# Patient Record
Sex: Male | Born: 1940
Health system: Southern US, Community
[De-identification: ages and names within clinical notes are randomized; demographics above are authoritative.]

## PROBLEM LIST (undated history)

## (undated) DIAGNOSIS — E559 Vitamin D deficiency, unspecified: Secondary | ICD-10-CM

## (undated) DIAGNOSIS — K760 Fatty (change of) liver, not elsewhere classified: Secondary | ICD-10-CM

## (undated) DIAGNOSIS — T7840XA Allergy, unspecified, initial encounter: Secondary | ICD-10-CM

## (undated) DIAGNOSIS — C4490 Unspecified malignant neoplasm of skin, unspecified: Secondary | ICD-10-CM

## (undated) DIAGNOSIS — J45909 Unspecified asthma, uncomplicated: Secondary | ICD-10-CM

## (undated) DIAGNOSIS — U071 COVID-19: Secondary | ICD-10-CM

## (undated) DIAGNOSIS — Z85828 Personal history of other malignant neoplasm of skin: Secondary | ICD-10-CM

## (undated) HISTORY — DX: Personal history of other malignant neoplasm of skin: Z85.828

## (undated) HISTORY — PX: HERNIA REPAIR: SHX51

## (undated) HISTORY — DX: Fatty (change of) liver, not elsewhere classified: K76.0

## (undated) HISTORY — DX: Allergy, unspecified, initial encounter: T78.40XA

## (undated) HISTORY — PX: FEMUR FRACTURE SURGERY: SHX633

## (undated) HISTORY — PX: CHOLECYSTECTOMY: SHX55

## (undated) HISTORY — DX: Vitamin D deficiency, unspecified: E55.9

## (undated) HISTORY — DX: Unspecified malignant neoplasm of skin, unspecified: C44.90

## (undated) HISTORY — PX: NOSE SURGERY: SHX723

## (undated) HISTORY — DX: COVID-19: U07.1

## (undated) HISTORY — DX: Unspecified asthma, uncomplicated: J45.909

---

## 2006-02-14 ENCOUNTER — Ambulatory Visit: Payer: Self-pay | Admitting: Surgery

## 2009-09-28 ENCOUNTER — Ambulatory Visit: Payer: Self-pay | Admitting: Surgery

## 2009-10-05 ENCOUNTER — Ambulatory Visit: Payer: Self-pay

## 2009-10-06 ENCOUNTER — Ambulatory Visit: Payer: Self-pay | Admitting: Surgery

## 2013-09-03 ENCOUNTER — Encounter: Payer: Self-pay | Admitting: Podiatry

## 2013-09-04 ENCOUNTER — Encounter: Payer: Self-pay | Admitting: Podiatry

## 2013-09-04 ENCOUNTER — Ambulatory Visit (INDEPENDENT_AMBULATORY_CARE_PROVIDER_SITE_OTHER): Payer: Medicare HMO | Admitting: Podiatry

## 2013-09-04 ENCOUNTER — Ambulatory Visit (INDEPENDENT_AMBULATORY_CARE_PROVIDER_SITE_OTHER): Payer: Medicare HMO

## 2013-09-04 VITALS — BP 137/82 | HR 76 | Resp 16 | Ht 67.0 in | Wt 164.0 lb

## 2013-09-04 DIAGNOSIS — M79673 Pain in unspecified foot: Secondary | ICD-10-CM

## 2013-09-04 DIAGNOSIS — M7750 Other enthesopathy of unspecified foot: Secondary | ICD-10-CM

## 2013-09-04 DIAGNOSIS — M79609 Pain in unspecified limb: Secondary | ICD-10-CM

## 2013-09-04 MED ORDER — TRIAMCINOLONE ACETONIDE 10 MG/ML IJ SUSP
10.0000 mg | Freq: Once | INTRAMUSCULAR | Status: AC
  Administered 2013-09-04: 10 mg

## 2013-09-06 NOTE — Progress Notes (Signed)
Subjective:     Patient ID: Brian Valencia, male   DOB: 1940-12-14, 73 y.o.   MRN: [id number]  HPI patient points to right lateral foot stating that has been sore and making ambulation difficult. Does not remember specific injury but states been bothering him more over the last couple months   Review of Systems     Objective:   Physical Exam Neurovascular status unchanged with pain on the lateral side of the right foot and the extensor complex and also into the peroneal complex near its insertion into the base of the fifth metatarsal. Muscle strength was adequate with no range of motion loss noted    Assessment:     Tendinitis of the right lateral foot with inflammation and fluid buildup    Plan:     H&P performed and x-rays reviewed. Injected the lateral side 3 mg Kenalog 5 mg Xylocaine Marcaine mixture and instructed on physical therapy and ice therapy. Discussed this may require him mobilization if it does not improve

## 2013-09-18 ENCOUNTER — Encounter: Payer: Self-pay | Admitting: Podiatry

## 2013-09-18 ENCOUNTER — Ambulatory Visit (INDEPENDENT_AMBULATORY_CARE_PROVIDER_SITE_OTHER): Payer: Medicare HMO | Admitting: Podiatry

## 2013-09-18 VITALS — BP 127/80 | HR 76 | Resp 16

## 2013-09-18 DIAGNOSIS — M722 Plantar fascial fibromatosis: Secondary | ICD-10-CM

## 2013-09-18 DIAGNOSIS — M779 Enthesopathy, unspecified: Secondary | ICD-10-CM

## 2013-09-18 NOTE — Progress Notes (Signed)
Subjective:     Patient ID: Brian Valencia, male   DOB: April 22, 1941, 73 y.o.   MRN: [id number]  HPI patient states that my foot is doing a lot better on the outside. Points the plantar and lateral side of the foot around the peroneal tendon group and lateral plantar fascia   Review of Systems     Objective:   Physical Exam Neurovascular status unchanged with health history intact and patient is found to have diminished discomfort lateral and plantar foot with good range of motion subtalar midtarsal joint    Assessment:     Improved inflammation of the foot lateral and plantar    Plan:     Advised on physical therapy and supportive shoes and reappoint for [place] to recheck again as needed for consideration of orthotic

## 2013-09-21 ENCOUNTER — Ambulatory Visit: Payer: Self-pay | Admitting: Gastroenterology

## 2013-09-21 LAB — HM COLONOSCOPY

## 2013-09-23 LAB — PATHOLOGY REPORT

## 2014-10-04 ENCOUNTER — Ambulatory Visit: Payer: Self-pay | Admitting: Registered Nurse

## 2015-07-13 DIAGNOSIS — K4090 Unilateral inguinal hernia, without obstruction or gangrene, not specified as recurrent: Secondary | ICD-10-CM | POA: Diagnosis not present

## 2015-07-14 NOTE — H&P (Signed)
Brian Valencia              ACCOUNT NO.:  [id number]  MEDICAL RECORD NO.:  [id number]  LOCATION:                                 FACILITY:  PHYSICIAN:  Maryan Puls          DATE OF BIRTH:  1941/04/18  DATE OF ADMISSION:  07/19/2015 DATE OF DISCHARGE:                            HISTORY AND PHYSICAL   MEDICAL RECORD CURRENTLY NOT AVAILABLE.  Same-day surgery July 19, 2015.  CHIEF COMPLAINT:  Right inguinal hernia.  HISTORY OF PRESENT ILLNESS:  [name] is a 75 year old Caucasian male with a 2 year history of intermittent right groin discomfort.  He has also noticed swelling in this region.  The discomfort is more frequent and bothersome to him now and he comes in today for right inguinal herniorrhaphy.  PAST MEDICAL HISTORY:  ALLERGIES:  NO DRUG ALLERGIES.  CURRENT MEDICATIONS:  No medications.  PREVIOUS SURGICAL PROCEDURES:  Include cholecystectomy in 2013, left inguinal herniorrhaphy in 1985.  SOCIAL HISTORY:  The patient denies tobacco use.  He drinks 4-5 alcoholic beverages per week.  FAMILY HISTORY:  Remarkable for parents with diabetes and hypertension.  PAST AND CURRENT MEDICAL CONDITIONS:  Negative for significant medical diseases.  REVIEW OF SYSTEMS:  The patient denied chest pain, shortness of breath, diabetes, stroke, or hypertension.  PHYSICAL EXAMINATION:  GENERAL:  A well-nourished white male, in no distress. HEENT: Sclerae were clear.  Pupils were equally round, reactive to light and accommodation.  Extraocular movements were intact. NECK:  Supple.  No palpable cervical adenopathy. LUNGS:  Clear to auscultation. CARDIOVASCULAR:  Regular rhythm and rate without audible murmurs. ABDOMEN:  Soft abdomen. GU:  Circumcised.  Testes atrophic, 16 mL in size each and easily reducible right inguinal hernia. RECTAL:  Deferred. NEUROMUSCULAR:  Alert and oriented x3.  IMPRESSION:  Right inguinal hernia.  PLAN:  Right inguinal  herniorrhaphy.          ______________________________ Maryan Puls     MW/MEDQ  D:  07/13/2015  T:  07/13/2015  Job:  [id number]

## 2015-07-15 ENCOUNTER — Other Ambulatory Visit: Payer: Self-pay

## 2015-07-15 ENCOUNTER — Encounter: Payer: Self-pay | Admitting: *Deleted

## 2015-07-15 NOTE — Patient Instructions (Signed)
  Your procedure is scheduled on: 07-19-15 (TUESDAY) Report to [place] To find out your arrival time please call [phone] between 1PM - 3PM on 07-28-15 [facility])  Remember: Instructions that are not followed completely may result in serious medical risk, up to and including death, or upon the discretion of your surgeon and anesthesiologist your surgery may need to be rescheduled.    _X___ 1. Do not eat food or drink liquids after midnight. No gum chewing or hard candies.     _X___ 2. No Alcohol for 24 hours before or after surgery.   ____ 3. Bring all medications with you on the day of surgery if instructed.    _X___ 4. Notify your doctor if there is any change in your medical condition     (cold, fever, infections).     Do not wear jewelry, make-up, hairpins, clips or nail polish.  Do not wear lotions, powders, or perfumes. You may wear deodorant.  Do not shave 48 hours prior to surgery. Men may shave face and neck.  Do not bring valuables to the hospital.    [facility] is not responsible for any belongings or valuables.               Contacts, dentures or bridgework may not be worn into surgery.  Leave your suitcase in the car. After surgery it may be brought to your room.  For patients admitted to the hospital, discharge time is determined by your treatment team.   Patients discharged the day of surgery will not be allowed to drive home.   Please read over the following fact sheets that you were given:      ____ Take these medicines the morning of surgery with A SIP OF WATER:    1. NONE  2.   3.   4.  5.  6.  ____ Fleet Enema (as directed)   _X___ Use CHG Soap as directed  ____ Use inhalers on the day of surgery  ____ Stop metformin 2 days prior to surgery    ____ Take 1/2 of usual insulin dose the night before surgery and none on the morning of surgery.   ____ Stop Coumadin/Plavix/aspirin-PT ALREADY STOPPED ASA  ____ Stop  Anti-inflammatories-NO NSAIDS OR ASA PRODUCTS-TYLENOL OK   ____ Stop supplements until after surgery.    ____ Bring C-Pap to the hospital.

## 2015-07-18 ENCOUNTER — Encounter
Admission: RE | Admit: 2015-07-18 | Discharge: 2015-07-18 | Disposition: A | Discharge status: Home / Self Care | Payer: PPO | Source: Ambulatory Visit | Attending: Urology | Admitting: Urology

## 2015-07-18 DIAGNOSIS — K4090 Unilateral inguinal hernia, without obstruction or gangrene, not specified as recurrent: Secondary | ICD-10-CM | POA: Diagnosis not present

## 2015-07-18 DIAGNOSIS — Z85828 Personal history of other malignant neoplasm of skin: Secondary | ICD-10-CM | POA: Diagnosis not present

## 2015-07-18 DIAGNOSIS — Z01810 Encounter for preprocedural cardiovascular examination: Secondary | ICD-10-CM | POA: Diagnosis not present

## 2015-07-18 DIAGNOSIS — J45909 Unspecified asthma, uncomplicated: Secondary | ICD-10-CM | POA: Diagnosis not present

## 2015-07-19 ENCOUNTER — Ambulatory Visit
Admission: RE | Admit: 2015-07-19 | Discharge: 2015-07-19 | Disposition: A | Discharge status: Home / Self Care | Payer: PPO | Source: Ambulatory Visit | Attending: Urology | Admitting: Urology

## 2015-07-19 ENCOUNTER — Ambulatory Visit: Payer: PPO | Admitting: Anesthesiology

## 2015-07-19 ENCOUNTER — Encounter
Admission: RE | Disposition: A | Discharge status: Home / Self Care | Payer: Self-pay | Source: Ambulatory Visit | Attending: Urology

## 2015-07-19 ENCOUNTER — Encounter: Payer: Self-pay | Admitting: *Deleted

## 2015-07-19 DIAGNOSIS — K4090 Unilateral inguinal hernia, without obstruction or gangrene, not specified as recurrent: Secondary | ICD-10-CM | POA: Diagnosis not present

## 2015-07-19 DIAGNOSIS — Z85828 Personal history of other malignant neoplasm of skin: Secondary | ICD-10-CM | POA: Insufficient documentation

## 2015-07-19 DIAGNOSIS — J45909 Unspecified asthma, uncomplicated: Secondary | ICD-10-CM | POA: Insufficient documentation

## 2015-07-19 HISTORY — PX: INGUINAL HERNIA REPAIR: SHX194

## 2015-07-19 SURGERY — REPAIR, HERNIA, INGUINAL, ADULT
Anesthesia: General | Laterality: Right | Wound class: Clean Contaminated

## 2015-07-19 MED ORDER — LACTATED RINGERS IV SOLN
INTRAVENOUS | Status: DC
  Administered 2015-07-19 (×2): via INTRAVENOUS

## 2015-07-19 MED ORDER — NEOMYCIN-POLYMYXIN B GU 40-200000 IR SOLN
Status: AC
  Filled 2015-07-19: qty 2

## 2015-07-19 MED ORDER — OXYCODONE HCL 5 MG/5ML PO SOLN: 5.0000 mg | Freq: Once | ORAL | Status: DC | PRN

## 2015-07-19 MED ORDER — LIDOCAINE HCL (PF) 1 % IJ SOLN
INTRAMUSCULAR | Status: AC
  Filled 2015-07-19: qty 30

## 2015-07-19 MED ORDER — CEFAZOLIN SODIUM 1-5 GM-% IV SOLN
1.0000 g | Freq: Once | INTRAVENOUS | Status: AC
  Administered 2015-07-19: 1 g via INTRAVENOUS

## 2015-07-19 MED ORDER — BUPIVACAINE-EPINEPHRINE (PF) 0.5% -1:200000 IJ SOLN
INTRAMUSCULAR | Status: AC
  Filled 2015-07-19: qty 30

## 2015-07-19 MED ORDER — BUPIVACAINE-EPINEPHRINE (PF) 0.5% -1:200000 IJ SOLN
INTRAMUSCULAR | Status: DC | PRN
  Administered 2015-07-19: 10 mL via PERINEURAL

## 2015-07-19 MED ORDER — FAMOTIDINE 20 MG PO TABS
20.0000 mg | ORAL_TABLET | Freq: Once | ORAL | Status: AC
  Administered 2015-07-19: 20 mg via ORAL

## 2015-07-19 MED ORDER — MIDAZOLAM HCL 2 MG/2ML IJ SOLN
INTRAMUSCULAR | Status: DC | PRN
  Administered 2015-07-19: 2 mg via INTRAVENOUS
  Administered 2015-07-19: 80 mg via INTRAVENOUS

## 2015-07-19 MED ORDER — FENTANYL CITRATE (PF) 100 MCG/2ML IJ SOLN
INTRAMUSCULAR | Status: DC | PRN
  Administered 2015-07-19 (×4): 25 ug via INTRAVENOUS

## 2015-07-19 MED ORDER — LIDOCAINE HCL 1 % IJ SOLN
INTRAMUSCULAR | Status: DC | PRN
  Administered 2015-07-19: 10 mL

## 2015-07-19 MED ORDER — DOCUSATE SODIUM 100 MG PO CAPS: 200.0000 mg | ORAL_CAPSULE | Freq: Two times a day (BID) | ORAL | Status: DC

## 2015-07-19 MED ORDER — ONDANSETRON 8 MG PO TBDP: 8.0000 mg | ORAL_TABLET | Freq: Four times a day (QID) | ORAL | Status: DC | PRN

## 2015-07-19 MED ORDER — FENTANYL CITRATE (PF) 100 MCG/2ML IJ SOLN: 25.0000 ug | INTRAMUSCULAR | Status: DC | PRN

## 2015-07-19 MED ORDER — CEFAZOLIN SODIUM-DEXTROSE 2-3 GM-% IV SOLR
INTRAVENOUS | Status: AC
  Filled 2015-07-19: qty 50

## 2015-07-19 MED ORDER — ONDANSETRON HCL 4 MG/2ML IJ SOLN
INTRAMUSCULAR | Status: DC | PRN
  Administered 2015-07-19: 4 mg via INTRAVENOUS

## 2015-07-19 MED ORDER — NUCYNTA 50 MG PO TABS: 50.0000 mg | ORAL_TABLET | Freq: Four times a day (QID) | ORAL | Status: DC | PRN

## 2015-07-19 MED ORDER — FAMOTIDINE 20 MG PO TABS
ORAL_TABLET | ORAL | Status: AC
  Administered 2015-07-19: 20 mg via ORAL
  Filled 2015-07-19: qty 1

## 2015-07-19 MED ORDER — PHENYLEPHRINE HCL 10 MG/ML IJ SOLN
INTRAMUSCULAR | Status: DC | PRN
  Administered 2015-07-19 (×4): 100 ug via INTRAVENOUS

## 2015-07-19 MED ORDER — PROPOFOL 10 MG/ML IV BOLUS
INTRAVENOUS | Status: DC | PRN
  Administered 2015-07-19: 140 mg via INTRAVENOUS
  Administered 2015-07-19: 60 mg via INTRAVENOUS
  Administered 2015-07-19: 40 mg via INTRAVENOUS

## 2015-07-19 MED ORDER — OXYCODONE HCL 5 MG PO TABS: 5.0000 mg | ORAL_TABLET | Freq: Once | ORAL | Status: DC | PRN

## 2015-07-19 MED ORDER — NEOMYCIN-POLYMYXIN B GU 40-200000 IR SOLN
Status: DC | PRN
  Administered 2015-07-19: 2 mL

## 2015-07-19 MED ORDER — DEXAMETHASONE SODIUM PHOSPHATE 10 MG/ML IJ SOLN
INTRAMUSCULAR | Status: DC | PRN
  Administered 2015-07-19: 10 mg via INTRAVENOUS

## 2015-07-19 MED ORDER — CEFAZOLIN SODIUM 1-5 GM-% IV SOLN
INTRAVENOUS | Status: AC
  Administered 2015-07-19: 1 g via INTRAVENOUS
  Filled 2015-07-19: qty 50

## 2015-07-19 MED ORDER — GLYCOPYRROLATE 0.2 MG/ML IJ SOLN
INTRAMUSCULAR | Status: DC | PRN
  Administered 2015-07-19: 0.2 mg via INTRAVENOUS

## 2015-07-19 MED ORDER — KETOROLAC TROMETHAMINE 30 MG/ML IJ SOLN
INTRAMUSCULAR | Status: DC | PRN
  Administered 2015-07-19: 30 mg via INTRAVENOUS

## 2015-07-19 MED ORDER — CEPHALEXIN 500 MG PO CAPS: 500.0000 mg | ORAL_CAPSULE | Freq: Two times a day (BID) | ORAL | Status: DC

## 2015-07-19 SURGICAL SUPPLY — 35 items
BLADE SURG 15 STRL LF DISP TIS (BLADE) ×1 IMPLANT
BLADE SURG 15 STRL SS (BLADE) ×2
CANISTER SUCT 1200ML W/VALVE (MISCELLANEOUS) ×3 IMPLANT
CLOSURE WOUND 1/2 X4 (GAUZE/BANDAGES/DRESSINGS) ×1
DRAIN PENROSE 5/8X12 LTX STRL (DRAIN) IMPLANT
DRAPE LAPAROTOMY 100X77 ABD (DRAPES) ×3 IMPLANT
DRESSING TELFA 4X3 1S ST N-ADH (GAUZE/BANDAGES/DRESSINGS) ×3 IMPLANT
DRSG TEGADERM 4X4.75 (GAUZE/BANDAGES/DRESSINGS) ×3 IMPLANT
GLOVE BIO SURGEON STRL SZ7.5 (GLOVE) ×3 IMPLANT
GOWN STRL REUS W/ TWL LRG LVL3 (GOWN DISPOSABLE) ×1 IMPLANT
GOWN STRL REUS W/ TWL XL LVL3 (GOWN DISPOSABLE) ×1 IMPLANT
GOWN STRL REUS W/TWL LRG LVL3 (GOWN DISPOSABLE) ×2
GOWN STRL REUS W/TWL XL LVL3 (GOWN DISPOSABLE) ×2
KIT RM TURNOVER STRD PROC AR (KITS) ×3 IMPLANT
LABEL OR SOLS (LABEL) ×3 IMPLANT
LIQUID BAND (GAUZE/BANDAGES/DRESSINGS) ×3 IMPLANT
MESH HERNIA 4.5X10 SYS PRO LRG (Mesh General) ×1 IMPLANT
MESH HERNIA SYS PROLENE LG (Mesh General) ×2 IMPLANT
NDL SAFETY 18GX1.5 (NEEDLE) ×3 IMPLANT
NEEDLE HYPO 25X1 1.5 SAFETY (NEEDLE) ×3 IMPLANT
NS IRRIG 500ML POUR BTL (IV SOLUTION) ×3 IMPLANT
PACK BASIN MINOR ARMC (MISCELLANEOUS) ×3 IMPLANT
PAD GROUND ADULT SPLIT (MISCELLANEOUS) ×3 IMPLANT
PREP PVP WINGED SPONGE (MISCELLANEOUS) ×3 IMPLANT
STRIP CLOSURE SKIN 1/2X4 (GAUZE/BANDAGES/DRESSINGS) ×2 IMPLANT
SUT CHROMIC 2 0 SH (SUTURE) ×3 IMPLANT
SUT CHROMIC 3-0 (SUTURE)
SUT CHROMIC 3-0 54XMFL REEL CR (SUTURE)
SUT PLAIN 3 0 SH 27IN (SUTURE) ×3 IMPLANT
SUT SURGILON 0 BLK (SUTURE) ×6 IMPLANT
SUT VIC AB 4-0 PS2 18 (SUTURE) ×3 IMPLANT
SUTURE CHRMC 3-0 54XMFL REL CR (SUTURE) IMPLANT
SWABSTK COMLB BENZOIN TINCTURE (MISCELLANEOUS) ×3 IMPLANT
SYR BULB IRRIG 60ML STRL (SYRINGE) ×3 IMPLANT
SYRINGE 10CC LL (SYRINGE) ×3 IMPLANT

## 2015-07-19 NOTE — Op Note (Signed)
Preoperative diagnosis: Right inguinal hernia Postoperative diagnosis: Right inguinal hernia  Procedure: 1. Right inguinal herniorrhaphy                      2. Spermatic cord block   Surgeon: Otelia Limes. Yves Dill MD, FACS Anesthesia: Gen.  Indications:See the history and physical. After informed consent the above procedure(s) were requested     Technique and findings: After adequate general anesthesia had been obtained the the abdomen and perineum were prepped and draped in the usual fashion. A right inguinal crease incision was made sharply and then carried to the subcutaneous fatty tissue with electrocautery. Spermatic cord and inguinal hernia were identified. The hernia sac was dissected back to the internal ring and ligated with a 3-0 Surgilon suture. The external oblique fascia was then cleared of overlying fatty tissue. A pocket was created in the retropubic space using finger dissection. The pocket was irrigated with GU irrigant. The Ethicon PHS the graft was selected and circular portion unfurled into the retropubic space. The oblong portion was placed parallel to the external oblique fascial fibers beneath the external oblique fascia. A keyhole incision was made laterally in the oblong portion the graft to accommodate the spermatic cord. The edges of the heel incision were brought back together around the cord and anchored to the inguinal ligament with a 3- Surgilon suture. The distal portion of the oblong section of the graft was sutured to the pubic tubercle with a 3-0 Surgilon suture. The surgical field was copiously irrigated with GU irrigant. External oblique fascia was then reapproximated with interrupted 30 Surgilon suture. Atretic cord was placed into its normal anatomic position. The right cord block was performed with 10 cc of 1% plain Xylocaine. Subcutaneous block was performed with 1% Xylocaine with epinephrine. Subcutaneous fat was then reapproximated with interrupted 30 plain catgut and  skin was closed with 4-0 Vicryl subcutaneous closure. Dermabond, benzoin, Steri-Strips and sterile dressing were applied. Sponge, needle, and instrument counts were noted be correct. The procedure was then terminated and patient transferred to the recovery room in stable condition.

## 2015-07-19 NOTE — Anesthesia Procedure Notes (Signed)
Procedure Name: LMA Insertion Date/Time: 07/19/2015 2:49 PM Performed by: Silvana Newness Pre-anesthesia Checklist: Patient identified, Emergency Drugs available, Suction available, Patient being monitored and Timeout performed Patient Re-evaluated:Patient Re-evaluated prior to inductionOxygen Delivery Method: Circle system utilized Preoxygenation: Pre-oxygenation with 100% oxygen Intubation Type: IV induction Ventilation: Oral airway inserted - appropriate to patient size LMA: LMA inserted LMA Size: 5.0 Number of attempts: 3 Placement Confirmation: positive ETCO2 and breath sounds checked- equal and bilateral Tube secured with: Tape Dental Injury: Teeth and Oropharynx as per pre-operative assessment  Comments: First placed LMA 4.5, did not have adequate tidal volumes.  Removed then placed LMA 4 with inflation, still had a leak and small tidal volumes.  Removed and placed LMA 5 with inflation, good seal obtained, no leak, adequate tidal volumes.

## 2015-07-19 NOTE — Transfer of Care (Signed)
Immediate Anesthesia Transfer of Care Note  Patient: Brian Valencia  Procedure(s) Performed: Procedure(s): HERNIA REPAIR INGUINAL ADULT WITH MESH (Right)  Patient Location: PACU  Anesthesia Type:General  Level of Consciousness: awake, alert , oriented and patient cooperative  Airway & Oxygen Therapy: Patient Spontanous Breathing and Patient connected to face mask oxygen  Post-op Assessment: Report given to RN, Post -op Vital signs reviewed and stable and Patient moving all extremities X 4  Post vital signs: Reviewed and stable  Last Vitals:  Filed Vitals:   07/19/15 1404  BP: 159/84  Pulse: 76  Temp: [id number] C    Complications: No apparent anesthesia complications

## 2015-07-19 NOTE — Anesthesia Preprocedure Evaluation (Signed)
Anesthesia Evaluation  Patient identified by MRN, date of birth, ID band Patient awake    Reviewed: Allergy & Precautions, H&P , NPO status , Patient's Chart, lab work & pertinent test results  History of Anesthesia Complications Negative for: history of anesthetic complications  Airway Mallampati: III  TM Distance: >3 FB Neck ROM: limited    Dental no notable dental hx. (+) Poor Dentition, Missing, Upper Dentures, Lower Dentures   Pulmonary asthma ,    Pulmonary exam normal breath sounds clear to auscultation       Cardiovascular Exercise Tolerance: Good (-) angina(-) Past MI negative cardio ROS Normal cardiovascular exam Rhythm:regular Rate:Normal     Neuro/Psych negative neurological ROS  negative psych ROS   GI/Hepatic negative GI ROS, Neg liver ROS, neg GERD  ,  Endo/Other  negative endocrine ROS  Renal/GU negative Renal ROS  negative genitourinary   Musculoskeletal   Abdominal   Peds  Hematology negative hematology ROS (+)   Anesthesia Other Findings Past Medical History:   Skin cancer                                                 Past Surgical History:   HERNIA REPAIR                                                 NOSE SURGERY                                                 BMI    Body Mass Index   25.68 kg/m 2      Reproductive/Obstetrics negative OB ROS                             Anesthesia Physical Anesthesia Plan  ASA: II  Anesthesia Plan: General LMA   Post-op Pain Management:    Induction:   Airway Management Planned:   Additional Equipment:   Intra-op Plan:   Post-operative Plan:   Informed Consent: I have reviewed the patients History and Physical, chart, labs and discussed the procedure including the risks, benefits and alternatives for the proposed anesthesia with the patient or authorized representative who has indicated his/her understanding  and acceptance.   Dental Advisory Given  Plan Discussed with: Anesthesiologist, CRNA and Surgeon  Anesthesia Plan Comments:         Anesthesia Quick Evaluation

## 2015-07-19 NOTE — Anesthesia Postprocedure Evaluation (Signed)
Anesthesia Post Note  Patient: Brian Valencia  Procedure(s) Performed: Procedure(s) (LRB): HERNIA REPAIR INGUINAL ADULT WITH MESH (Right)  Patient location during evaluation: PACU Anesthesia Type: General Level of consciousness: awake and alert Pain management: pain level controlled Vital Signs Assessment: post-procedure vital signs reviewed and stable Respiratory status: spontaneous breathing, nonlabored ventilation, respiratory function stable and patient connected to nasal cannula oxygen Cardiovascular status: blood pressure returned to baseline and stable Postop Assessment: no signs of nausea or vomiting Anesthetic complications: no    Last Vitals:  Filed Vitals:   07/19/15 1655 07/19/15 1700  BP: 134/82 144/85  Pulse: 81 72  Temp:    Resp: 14 16    Last Pain:  Filed Vitals:   07/19/15 1711  PainSc: 1                  Precious Haws Teaira Croft

## 2015-07-19 NOTE — H&P (Signed)
Date of Initial H&P: 07/12/14  History reviewed, patient examined, no change in status, stable for surgery.

## 2015-07-19 NOTE — Discharge Instructions (Addendum)
PATIENT INSTRUCTIONS HERNIA  FOLLOW-UP:  Please make an appointment with your physician in 10 days.Inguinal Hernia, Adult Muscles help keep everything in the body in its proper place. But if a weak spot in the muscles develops, something can poke through. That is called a hernia. When this happens in the lower part of the belly (abdomen), it is called an inguinal hernia. (It takes its name from a part of the body in this region called the inguinal canal.) A weak spot in the wall of muscles lets some fat or part of the small intestine bulge through. An inguinal hernia can develop at any age. Men get them more often than women. CAUSES  In adults, an inguinal hernia develops over time.  It can be triggered by:  Suddenly straining the muscles of the lower abdomen.  Lifting heavy objects.  Straining to have a bowel movement. Difficult bowel movements (constipation) can lead to this.  Constant coughing. This may be caused by smoking or lung disease.  Being overweight.  Being pregnant.  Working at a job that requires long periods of standing or heavy lifting.  Having had an inguinal hernia before. One type can be an emergency situation. It is called a strangulated inguinal hernia. It develops if part of the small intestine slips through the weak spot and cannot get back into the abdomen. The blood supply can be cut off. If that happens, part of the intestine may die. This situation requires emergency surgery. SYMPTOMS  Often, a small inguinal hernia has no symptoms. It is found when a healthcare provider does a physical exam. Larger hernias usually have symptoms.   In adults, symptoms may include:  A lump in the groin. This is easier to see when the person is standing. It might disappear when lying down.  In men, a lump in the scrotum.  Pain or burning in the groin. This occurs especially when lifting, straining or coughing.  A dull ache or feeling of pressure in the groin.  Signs of  a strangulated hernia can include:  A bulge in the groin that becomes very painful and tender to the touch.  A bulge that turns red or purple.  Fever, nausea and vomiting.  Inability to have a bowel movement or to pass gas. DIAGNOSIS  To decide if you have an inguinal hernia, a healthcare provider will probably do a physical examination.  This will include asking questions about any symptoms you have noticed.  The healthcare provider might feel the groin area and ask you to cough. If an inguinal hernia is felt, the healthcare provider may try to slide it back into the abdomen.  Usually no other tests are needed. TREATMENT  Treatments can vary. The size of the hernia makes a difference. Options include:  Watchful waiting. This is often suggested if the hernia is small and you have had no symptoms.  No medical procedure will be done unless symptoms develop.  You will need to watch closely for symptoms. If any occur, contact your healthcare provider right away.  Surgery. This is used if the hernia is larger or you have symptoms.  Open surgery. This is usually an outpatient procedure (you will not stay overnight in a hospital). An cut (incision) is made through the skin in the groin. The hernia is put back inside the abdomen. The weak area in the muscles is then repaired by herniorrhaphy or hernioplasty. Herniorrhaphy: in this type of surgery, the weak muscles are sewn back together. Hernioplasty: a patch  or mesh is used to close the weak area in the abdominal wall.  Laparoscopy. In this procedure, a surgeon makes small incisions. A thin tube with a tiny video camera (called a laparoscope) is put into the abdomen. The surgeon repairs the hernia with mesh by looking with the video camera and using two long instruments. HOME CARE INSTRUCTIONS   After surgery to repair an inguinal hernia:  You will need to take pain medicine prescribed by your healthcare provider. Follow all directions  carefully.  You will need to take care of the wound from the incision.  Your activity will be restricted for awhile. This will probably include no heavy lifting for several weeks. You also should not do anything too active for a few weeks. When you can return to work will depend on the type of job that you have.  During "watchful waiting" periods, you should:  Maintain a healthy weight.  Eat a diet high in fiber (fruits, vegetables and whole grains).  Drink plenty of fluids to avoid constipation. This means drinking enough water and other liquids to keep your urine clear or pale yellow.  Do not lift heavy objects.  Do not stand for long periods of time.  Quit smoking. This should keep you from developing a frequent cough. SEEK MEDICAL CARE IF:   A bulge develops in your groin area.  You feel pain, a burning sensation or pressure in the groin. This might be worse if you are lifting or straining.  You develop a fever of more than 100.5 F (38.1 C). SEEK IMMEDIATE MEDICAL CARE IF:   Pain in the groin increases suddenly.  A bulge in the groin gets bigger suddenly and does not go down.  For men, there is sudden pain in the scrotum. Or, the size of the scrotum increases.  A bulge in the groin area becomes red or purple and is painful to touch.  You have nausea or vomiting that does not go away.  You feel your heart beating much faster than normal.  You cannot have a bowel movement or pass gas.  You develop a fever of more than 102.0 F (38.9 C).   This information is not intended to replace advice given to you by your health care provider. Make sure you discuss any questions you have with your health care provider.   Document Released: 11/11/2008 Document Revised: 09/17/2011 Document Reviewed: 12/27/2014 Elsevier Interactive Patient Education 2016 [employer].   Call your physician immediately if you have any fevers greater than 102.5, drainage from you wound that is not  clear or looks infected, persistent bleeding, increasing abdominal pain, problems urinating, or persistent nausea/vomiting.    WOUND CARE INSTRUCTIONS:  Keep a dry clean dressing on the wound if there is drainage. The initial bandage may be removed after 24 hours.  Once the wound has quit draining you may leave it open to air.  If clothing rubs against the wound or causes irritation and the wound is not draining you may cover it with a dry dressing during the daytime.  Try to keep the wound dry and avoid ointments on the wound unless directed to do so.  If the wound becomes bright red and painful or starts to drain infected material that is not clear, please contact your physician immediately.  If the wound is mildly pink and has a thick firm ridge underneath it, this is normal, and is referred to as a healing ridge.  This will resolve over the next  4-6 weeks.  DIET:  You may eat any foods that you can tolerate.  It is a good idea to eat a high fiber diet and take in plenty of fluids to prevent constipation.  If you do become constipated you may want to take a mild laxative or take ducolax tablets on a daily basis until your bowel habits are regular.  Constipation can be very uncomfortable, along with straining, after recent abdominal surgery.  ACTIVITY:  You are encouraged to cough and deep breath or use your incentive spirometer if you were given one, every 15-30 minutes when awake.  This will help prevent respiratory complications and low grade fevers post-operatively.  You may want to hug a pillow when coughing and sneezing to add additional support to the surgical area which will decrease pain during these times.  You are encouraged to walk and engage in light activity for the next two weeks.  You should not lift more than 20 pounds during this time frame as it could put you at increased risk for a hernia recurrence.  Twenty pounds is roughly equivalent to a plastic bag of groceries.    MEDICATIONS:   Try to take narcotic medications and anti-inflammatory medications, such as tylenol, ibuprofen, naprosyn, etc., with food.  This will minimize stomach upset from the medication.  Should you develop nausea and vomiting from the pain medication, or develop a rash, please discontinue the medication and contact your physician.  You should not drive, make important decisions, or operate machinery when taking narcotic pain medication.  QUESTIONS:  Please feel free to call your physician or the hospital operator if you have any questions, and they will be glad to assist you.

## 2015-07-20 ENCOUNTER — Encounter: Payer: Self-pay | Admitting: Urology

## 2015-07-21 LAB — SURGICAL PATHOLOGY

## 2015-07-22 ENCOUNTER — Encounter: Payer: Self-pay | Admitting: Urology

## 2016-04-02 DIAGNOSIS — Z08 Encounter for follow-up examination after completed treatment for malignant neoplasm: Secondary | ICD-10-CM | POA: Diagnosis not present

## 2016-04-02 DIAGNOSIS — Z09 Encounter for follow-up examination after completed treatment for conditions other than malignant neoplasm: Secondary | ICD-10-CM | POA: Diagnosis not present

## 2016-04-02 DIAGNOSIS — L570 Actinic keratosis: Secondary | ICD-10-CM | POA: Diagnosis not present

## 2016-04-02 DIAGNOSIS — Z1283 Encounter for screening for malignant neoplasm of skin: Secondary | ICD-10-CM | POA: Diagnosis not present

## 2016-04-02 DIAGNOSIS — Z872 Personal history of diseases of the skin and subcutaneous tissue: Secondary | ICD-10-CM | POA: Diagnosis not present

## 2016-04-02 DIAGNOSIS — Z85828 Personal history of other malignant neoplasm of skin: Secondary | ICD-10-CM | POA: Diagnosis not present

## 2016-07-31 DIAGNOSIS — H903 Sensorineural hearing loss, bilateral: Secondary | ICD-10-CM | POA: Diagnosis not present

## 2016-07-31 DIAGNOSIS — H6123 Impacted cerumen, bilateral: Secondary | ICD-10-CM | POA: Diagnosis not present

## 2016-11-01 DIAGNOSIS — H5203 Hypermetropia, bilateral: Secondary | ICD-10-CM | POA: Diagnosis not present

## 2017-01-14 DIAGNOSIS — C4441 Basal cell carcinoma of skin of scalp and neck: Secondary | ICD-10-CM | POA: Diagnosis not present

## 2017-01-14 DIAGNOSIS — D485 Neoplasm of uncertain behavior of skin: Secondary | ICD-10-CM | POA: Diagnosis not present

## 2017-01-29 DIAGNOSIS — C4441 Basal cell carcinoma of skin of scalp and neck: Secondary | ICD-10-CM | POA: Diagnosis not present

## 2017-01-29 DIAGNOSIS — C4491 Basal cell carcinoma of skin, unspecified: Secondary | ICD-10-CM | POA: Diagnosis not present

## 2017-02-13 DIAGNOSIS — B999 Unspecified infectious disease: Secondary | ICD-10-CM | POA: Diagnosis not present

## 2017-02-20 DIAGNOSIS — B999 Unspecified infectious disease: Secondary | ICD-10-CM | POA: Diagnosis not present

## 2017-07-29 DIAGNOSIS — I781 Nevus, non-neoplastic: Secondary | ICD-10-CM | POA: Diagnosis not present

## 2017-07-29 DIAGNOSIS — L570 Actinic keratosis: Secondary | ICD-10-CM | POA: Diagnosis not present

## 2017-07-29 DIAGNOSIS — Z86018 Personal history of other benign neoplasm: Secondary | ICD-10-CM | POA: Diagnosis not present

## 2017-07-29 DIAGNOSIS — L578 Other skin changes due to chronic exposure to nonionizing radiation: Secondary | ICD-10-CM | POA: Diagnosis not present

## 2017-07-29 DIAGNOSIS — Z85828 Personal history of other malignant neoplasm of skin: Secondary | ICD-10-CM | POA: Diagnosis not present

## 2017-07-29 DIAGNOSIS — L280 Lichen simplex chronicus: Secondary | ICD-10-CM | POA: Diagnosis not present

## 2017-11-07 ENCOUNTER — Encounter: Payer: Self-pay | Admitting: Podiatry

## 2017-11-07 ENCOUNTER — Ambulatory Visit: Payer: PPO | Admitting: Podiatry

## 2017-11-07 VITALS — BP 125/61 | HR 55

## 2017-11-07 DIAGNOSIS — M722 Plantar fascial fibromatosis: Secondary | ICD-10-CM

## 2017-11-07 DIAGNOSIS — R52 Pain, unspecified: Secondary | ICD-10-CM

## 2017-11-07 NOTE — Progress Notes (Signed)
This patient presents the office with chief complaint of pain noted on the bottom of his left foot.  He points to an area proximal to the metatarsal heads.  He says that the pain has been developing in this area for the last few weeks.  He denies any history of trauma or injury to the foot.  He has provided no self treatment nor sought any professional help. He has previous problems with his left foot related to a fracture of his left leg.  He was treated successfully by Dr. Paulla Dolly.  He presents the office today for an evaluation and treatment of his painful left foot.  General Appearance  Alert, conversant and in no acute stress.  Vascular  Dorsalis pedis and posterior tibial  pulses are palpable  bilaterally.  Capillary return is within normal limits  bilaterally. Temperature is within normal limits  bilaterally.  Neurologic  Senn-Weinstein monofilament wire test diminished  bilaterally. Muscle power within normal limits bilaterally.  Nails normal nails noted with no evidence of any bacterial or fungal infection  Orthopedic  No limitations of motion of motion feet .  No crepitus or effusions noted.  No bony pathology or digital deformities noted. Palpable pain noted to the distal aspect of the plantar fascia before it  inserts into the metatarsals left foot. HAV  B/L.  Skin  normotropic skin with no porokeratosis noted bilaterally.  No signs of infections or ulcers noted.    Plantar fasciitis left foot.  IE  Discussed this condition with this patient.  Recommended he wear power step insoles in his shoes.  He chose not to purchase these insoles today until he gets his dress shoes.  RTC prn.   Gardiner Barefoot DPM

## 2018-06-20 ENCOUNTER — Ambulatory Visit (INDEPENDENT_AMBULATORY_CARE_PROVIDER_SITE_OTHER): Payer: PPO | Admitting: Internal Medicine

## 2018-06-20 VITALS — BP 120/84 | HR 71 | Temp 97.5°F | Ht 67.0 in | Wt 167.0 lb

## 2018-06-20 DIAGNOSIS — Z125 Encounter for screening for malignant neoplasm of prostate: Secondary | ICD-10-CM

## 2018-06-20 DIAGNOSIS — R7303 Prediabetes: Secondary | ICD-10-CM | POA: Diagnosis not present

## 2018-06-20 DIAGNOSIS — E559 Vitamin D deficiency, unspecified: Secondary | ICD-10-CM

## 2018-06-20 DIAGNOSIS — R5383 Other fatigue: Secondary | ICD-10-CM | POA: Diagnosis not present

## 2018-06-20 DIAGNOSIS — Z1389 Encounter for screening for other disorder: Secondary | ICD-10-CM

## 2018-06-20 DIAGNOSIS — Z13220 Encounter for screening for lipoid disorders: Secondary | ICD-10-CM

## 2018-06-20 DIAGNOSIS — K4091 Unilateral inguinal hernia, without obstruction or gangrene, recurrent: Secondary | ICD-10-CM | POA: Diagnosis not present

## 2018-06-20 NOTE — Patient Instructions (Signed)

## 2018-06-20 NOTE — Progress Notes (Signed)
Pre visit review using our clinic review tool, if applicable. No additional management support is needed unless otherwise documented below in the visit note. 

## 2018-06-23 ENCOUNTER — Other Ambulatory Visit: Payer: Self-pay | Admitting: Internal Medicine

## 2018-06-23 ENCOUNTER — Other Ambulatory Visit (INDEPENDENT_AMBULATORY_CARE_PROVIDER_SITE_OTHER): Payer: PPO

## 2018-06-23 DIAGNOSIS — R5383 Other fatigue: Secondary | ICD-10-CM | POA: Diagnosis not present

## 2018-06-23 DIAGNOSIS — Z1389 Encounter for screening for other disorder: Secondary | ICD-10-CM | POA: Diagnosis not present

## 2018-06-23 DIAGNOSIS — R739 Hyperglycemia, unspecified: Secondary | ICD-10-CM

## 2018-06-23 DIAGNOSIS — Z125 Encounter for screening for malignant neoplasm of prostate: Secondary | ICD-10-CM

## 2018-06-23 DIAGNOSIS — E559 Vitamin D deficiency, unspecified: Secondary | ICD-10-CM

## 2018-06-23 DIAGNOSIS — Z13220 Encounter for screening for lipoid disorders: Secondary | ICD-10-CM

## 2018-06-23 LAB — CBC WITH DIFFERENTIAL/PLATELET
BASOS ABS: 0 10*3/uL (ref 0.0–0.1)
Basophils Relative: 0.4 % (ref 0.0–3.0)
EOS PCT: 7.9 % — AB (ref 0.0–5.0)
Eosinophils Absolute: 0.5 10*3/uL (ref 0.0–0.7)
HCT: 49.5 % (ref 39.0–52.0)
Hemoglobin: 16.6 g/dL (ref 13.0–17.0)
LYMPHS ABS: 2.9 10*3/uL (ref 0.7–4.0)
Lymphocytes Relative: 41.5 % (ref 12.0–46.0)
MCHC: 33.6 g/dL (ref 30.0–36.0)
MCV: 94.4 fl (ref 78.0–100.0)
MONOS PCT: 7.2 % (ref 3.0–12.0)
Monocytes Absolute: 0.5 10*3/uL (ref 0.1–1.0)
NEUTROS ABS: 3 10*3/uL (ref 1.4–7.7)
NEUTROS PCT: 43 % (ref 43.0–77.0)
PLATELETS: 216 10*3/uL (ref 150.0–400.0)
RBC: 5.24 Mil/uL (ref 4.22–5.81)
RDW: 12.4 % (ref 11.5–15.5)
WBC: 6.9 10*3/uL (ref 4.0–10.5)

## 2018-06-23 LAB — COMPREHENSIVE METABOLIC PANEL
ALT: 33 U/L (ref 0–53)
AST: 20 U/L (ref 0–37)
Albumin: 4.2 g/dL (ref 3.5–5.2)
Alkaline Phosphatase: 67 U/L (ref 39–117)
BUN: 14 mg/dL (ref 6–23)
CO2: 28 meq/L (ref 19–32)
Calcium: 9.6 mg/dL (ref 8.4–10.5)
Chloride: 102 mEq/L (ref 96–112)
Creatinine, Ser: 1.05 mg/dL (ref 0.40–1.50)
GFR: 73.1 mL/min (ref >60.00)
GLUCOSE: 123 mg/dL — AB (ref 70–99)
POTASSIUM: 4.7 meq/L (ref 3.5–5.1)
Sodium: 138 mEq/L (ref 135–145)
Total Bilirubin: 1.2 mg/dL (ref 0.2–1.2)
Total Protein: 6.4 g/dL (ref 6.0–8.3)

## 2018-06-23 LAB — LIPID PANEL
CHOL/HDL RATIO: 3
Cholesterol: 143 mg/dL (ref 0–200)
HDL: 41.5 mg/dL (ref >39.00)
LDL Cholesterol: 73 mg/dL (ref 0–99)
NonHDL: 101.2
TRIGLYCERIDES: 143 mg/dL (ref 0.0–149.0)
VLDL: 28.6 mg/dL (ref 0.0–40.0)

## 2018-06-23 LAB — VITAMIN D 25 HYDROXY (VIT D DEFICIENCY, FRACTURES): VITD: 28.09 ng/mL — ABNORMAL LOW (ref 30.00–100.00)

## 2018-06-23 LAB — PSA, MEDICARE: PSA: 2.26 ng/ml (ref 0.10–4.00)

## 2018-06-23 LAB — TSH: TSH: 1.27 u[IU]/mL (ref 0.35–4.50)

## 2018-06-23 LAB — HEMOGLOBIN A1C: Hgb A1c MFr Bld: 6 % (ref 4.6–6.5)

## 2018-06-24 ENCOUNTER — Telehealth: Payer: Self-pay | Admitting: *Deleted

## 2018-06-24 ENCOUNTER — Encounter: Payer: Self-pay | Admitting: Internal Medicine

## 2018-06-24 DIAGNOSIS — R7303 Prediabetes: Secondary | ICD-10-CM | POA: Insufficient documentation

## 2018-06-24 DIAGNOSIS — R5383 Other fatigue: Secondary | ICD-10-CM | POA: Insufficient documentation

## 2018-06-24 LAB — URINALYSIS, ROUTINE W REFLEX MICROSCOPIC
BILIRUBIN UA: NEGATIVE
Glucose, UA: NEGATIVE
Ketones, UA: NEGATIVE
Leukocytes, UA: NEGATIVE
NITRITE UA: NEGATIVE
PH UA: 5 (ref 5.0–7.5)
Protein, UA: NEGATIVE
RBC UA: NEGATIVE
Specific Gravity, UA: 1.016 (ref 1.005–1.030)
UUROB: 0.2 mg/dL (ref 0.2–1.0)

## 2018-06-24 NOTE — Telephone Encounter (Signed)
Copied from [place] [id number]. Topic: General - Inquiry >> Jun 24, 2018  7:42 AM Conception Chancy, NT wrote: Reason for CRM: patient is calling and states his wife is coming in today with a release form for his lab work to be released.

## 2018-06-24 NOTE — Progress Notes (Addendum)
Chief Complaint  Patient presents with  . Establish Care   New patient  1. C/o fatigue and getting tired at times sooner than normally would and low energy. Denies OSA sx's  2. C/o right groin protrusion h/o hernia repair  3. Fatty liver noted imaging 09/28/09    Review of Systems  Constitutional: Positive for malaise/fatigue. Negative for weight loss.  HENT: Negative for hearing loss.   Eyes: Negative for blurred vision.  Respiratory: Negative for shortness of breath.   Cardiovascular: Negative for chest pain.  Gastrointestinal: Negative for abdominal pain.  Genitourinary:       +right groin protrusion    Musculoskeletal: Negative for falls.  Skin: Negative for rash.  Neurological: Negative for headaches.  Psychiatric/Behavioral: Negative for depression and memory loss.   Past Medical History:  Diagnosis Date  . Skin cancer    Past Surgical History:  Procedure Laterality Date  . HERNIA REPAIR    . INGUINAL HERNIA REPAIR Right 07/19/2015   Procedure: HERNIA REPAIR INGUINAL ADULT WITH MESH;  Surgeon: Royston Cowper, MD;  Location: [facility] ORS;  Service: Urology;  Laterality: Right;  . NOSE SURGERY     No family history on file. Social History   Socioeconomic History  . Marital status: Married    Spouse name: Not on file  . Number of children: Not on file  . Years of education: Not on file  . Highest education level: Not on file  Occupational History  . Not on file  Social Needs  . Financial resource strain: Not on file  . Food insecurity:    Worry: Not on file    Inability: Not on file  . Transportation needs:    Medical: Not on file    Non-medical: Not on file  Tobacco Use  . Smoking status: Never Smoker  . Smokeless tobacco: Never Used  Substance and Sexual Activity  . Alcohol use: Yes    Comment: daily  . Drug use: No  . Sexual activity: Not on file  Lifestyle  . Physical activity:    Days per week: Not on file    Minutes per session: Not on file  .  Stress: Not on file  Relationships  . Social connections:    Talks on phone: Not on file    Gets together: Not on file    Attends religious service: Not on file    Active member of club or organization: Not on file    Attends meetings of clubs or organizations: Not on file    Relationship status: Not on file  . Intimate partner violence:    Fear of current or ex partner: Not on file    Emotionally abused: Not on file    Physically abused: Not on file    Forced sexual activity: Not on file  Other Topics Concern  . Not on file  Social History Narrative  . Not on file   No outpatient medications have been marked as taking for the 06/20/18 encounter (Office Visit) with McLean-Scocuzza, Nino Glow, MD.   No Known Allergies Recent Results (from the past 2160 hour(s))  Urinalysis, Routine w reflex microscopic     Status: Abnormal   Collection Time: 06/23/18  8:29 AM  Result Value Ref Range   Specific Gravity, UA 1.016 1.005 - 1.030   pH, UA 5.0 5.0 - 7.5   Color, UA Yellow Yellow   Appearance Ur Turbid (A) Clear   Leukocytes, UA Negative Negative   Protein, UA Negative  Negative/Trace   Glucose, UA Negative Negative   Ketones, UA Negative Negative   RBC, UA Negative Negative   Bilirubin, UA Negative Negative   Urobilinogen, Ur 0.2 0.2 - 1.0 mg/dL   Nitrite, UA Negative Negative   Microscopic Examination Comment     Comment: Microscopic not indicated and not performed.  PSA, Medicare ( [place] only)     Status: None   Collection Time: 06/23/18  8:29 AM  Result Value Ref Range   PSA 2.26 0.10 - 4.00 ng/ml    Comment: Test performed using Access Hybritech PSA Assay, a parmagnetic partical, chemiluminecent immunoassay.  Vitamin D (25 hydroxy)     Status: Abnormal   Collection Time: 06/23/18  8:29 AM  Result Value Ref Range   VITD 28.09 (L) 30.00 - 100.00 ng/mL  TSH     Status: None   Collection Time: 06/23/18  8:29 AM  Result Value Ref Range   TSH 1.27 0.35 - 4.50 uIU/mL   Lipid panel     Status: None   Collection Time: 06/23/18  8:29 AM  Result Value Ref Range   Cholesterol 143 0 - 200 mg/dL    Comment: ATP III Classification       Desirable:  < 200 mg/dL               Borderline High:  200 - 239 mg/dL          High:  > = 240 mg/dL   Triglycerides 143.0 0.0 - 149.0 mg/dL    Comment: Normal:  <150 mg/dLBorderline High:  150 - 199 mg/dL   HDL 41.50 >39.00 mg/dL   VLDL 28.6 0.0 - 40.0 mg/dL   LDL Cholesterol 73 0 - 99 mg/dL   Total CHOL/HDL Ratio 3     Comment:                Men          Women1/2 Average Risk     3.4          3.3Average Risk          5.0          4.42X Average Risk          9.6          7.13X Average Risk          15.0          11.0                       NonHDL 101.20     Comment: NOTE:  Non-HDL goal should be 30 mg/dL higher than patient's LDL goal (i.e. LDL goal of < 70 mg/dL, would have non-HDL goal of < 100 mg/dL)  CBC with Differential/Platelet     Status: Abnormal   Collection Time: 06/23/18  8:29 AM  Result Value Ref Range   WBC 6.9 4.0 - 10.5 K/uL   RBC 5.24 4.22 - 5.81 Mil/uL   Hemoglobin 16.6 13.0 - 17.0 g/dL   HCT 49.5 39.0 - 52.0 %   MCV 94.4 78.0 - 100.0 fl   MCHC 33.6 30.0 - 36.0 g/dL   RDW 12.4 11.5 - 15.5 %   Platelets 216.0 150.0 - 400.0 K/uL   Neutrophils Relative % 43.0 43.0 - 77.0 %   Lymphocytes Relative 41.5 12.0 - 46.0 %   Monocytes Relative 7.2 3.0 - 12.0 %   Eosinophils Relative 7.9 (H) 0.0 - 5.0 %  Basophils Relative 0.4 0.0 - 3.0 %   Neutro Abs 3.0 1.4 - 7.7 K/uL   Lymphs Abs 2.9 0.7 - 4.0 K/uL   Monocytes Absolute 0.5 0.1 - 1.0 K/uL   Eosinophils Absolute 0.5 0.0 - 0.7 K/uL   Basophils Absolute 0.0 0.0 - 0.1 K/uL  Comprehensive metabolic panel     Status: Abnormal   Collection Time: 06/23/18  8:29 AM  Result Value Ref Range   Sodium 138 135 - 145 mEq/L   Potassium 4.7 3.5 - 5.1 mEq/L   Chloride 102 96 - 112 mEq/L   CO2 28 19 - 32 mEq/L   Glucose, Bld 123 (H) 70 - 99 mg/dL   BUN 14 6 - 23 mg/dL    Creatinine, Ser 1.05 0.40 - 1.50 mg/dL   Total Bilirubin 1.2 0.2 - 1.2 mg/dL   Alkaline Phosphatase 67 39 - 117 U/L   AST 20 0 - 37 U/L   ALT 33 0 - 53 U/L   Total Protein 6.4 6.0 - 8.3 g/dL   Albumin 4.2 3.5 - 5.2 g/dL   Calcium 9.6 8.4 - 10.5 mg/dL   GFR 73.10 >60.00 mL/min  Hemoglobin A1c     Status: None   Collection Time: 06/23/18  3:10 PM  Result Value Ref Range   Hgb A1c MFr Bld 6.0 4.6 - 6.5 %    Comment: Glycemic Control Guidelines for People with Diabetes:Non Diabetic:  <6%Goal of Therapy: <7%Additional Action Suggested:  >8%    Objective  Body mass index is 26.16 kg/m. Wt Readings from Last 3 Encounters:  06/20/18 167 lb (75.8 kg)  07/19/15 164 lb (74.4 kg)  09/04/13 164 lb (74.4 kg)   Temp Readings from Last 3 Encounters:  06/20/18 (!) 97.5 F (36.4 C) (Oral)  07/19/15 98.4 F (36.9 C)   BP Readings from Last 3 Encounters:  06/20/18 120/84  11/07/17 125/61  07/19/15 (!) 144/85   Pulse Readings from Last 3 Encounters:  06/20/18 71  11/07/17 (!) 55  07/19/15 72    Physical Exam Vitals signs and nursing note reviewed.  Constitutional:      Appearance: Normal appearance.  HENT:     Head: Normocephalic and atraumatic.     Nose: Nose normal.     Mouth/Throat:     Mouth: Mucous membranes are moist.     Pharynx: Oropharynx is clear.  Eyes:     Conjunctiva/sclera: Conjunctivae normal.     Pupils: Pupils are equal, round, and reactive to light.  Cardiovascular:     Rate and Rhythm: Normal rate and regular rhythm.     Heart sounds: Normal heart sounds.  Pulmonary:     Effort: Pulmonary effort is normal.     Breath sounds: Normal breath sounds.  Skin:    General: Skin is warm and dry.  Neurological:     General: No focal deficit present.     Mental Status: He is alert and oriented to person, place, and time.     Gait: Gait normal.  Psychiatric:        Attention and Perception: Attention and perception normal.        Mood and Affect: Mood and affect  normal.        Speech: Speech normal.        Behavior: Behavior normal. Behavior is cooperative.        Thought Content: Thought content normal.        Cognition and Memory: Cognition and memory normal.  Judgment: Judgment normal.     Assessment   1. Fatigue denies OSA sx's will w/u with labs  2.s/p hernia surgery b/l with mesh right groin with protrusion  3. HM Plan   1. Fasting labs  F/u w/in 3 months consider cardiac risk assessment if continues  Consider imaging if continues I.e CXR, US abdomen 2. Declines [place] for now if bothersome will w/u  3.  Flu shot had 04/11/18  Given Rx Tdap  Consider prevnar, pna 23 and shingrix vaccines in future  -per pt pna vx had in 2018 need to check [facility]  Consider hep A/B vaccine fatty liver noted [place] ab 09/28/09   Check fasting labs  Declines MMR check  Colonoscopy had 09/23/13 cecal polyp negative pathology polypoid colonic mucosa neg pathologic changes will CC GI to see when due colonoscopy [facility] Dr. Candace Cruise did prev -cecal polyp colonoscopy path 09/21/13 polypoid mucosa neg path no repeat colonoscopy rec per [facility]  Check PSA  Former smoker age [age] 2 ppd  Dermatology Dr. Phillip Heal in [place] due early 2020  Check ears at f/u and clean     Provider: Dr. Olivia Mackie McLean-Scocuzza-Internal Medicine

## 2018-06-26 ENCOUNTER — Encounter: Payer: Self-pay | Admitting: Internal Medicine

## 2018-07-21 DIAGNOSIS — C4441 Basal cell carcinoma of skin of scalp and neck: Secondary | ICD-10-CM | POA: Diagnosis not present

## 2018-07-21 DIAGNOSIS — Z86018 Personal history of other benign neoplasm: Secondary | ICD-10-CM | POA: Diagnosis not present

## 2018-07-21 DIAGNOSIS — Z85828 Personal history of other malignant neoplasm of skin: Secondary | ICD-10-CM | POA: Diagnosis not present

## 2018-07-21 DIAGNOSIS — L578 Other skin changes due to chronic exposure to nonionizing radiation: Secondary | ICD-10-CM | POA: Diagnosis not present

## 2018-07-21 DIAGNOSIS — Z1283 Encounter for screening for malignant neoplasm of skin: Secondary | ICD-10-CM | POA: Diagnosis not present

## 2018-07-21 DIAGNOSIS — C44519 Basal cell carcinoma of skin of other part of trunk: Secondary | ICD-10-CM | POA: Diagnosis not present

## 2018-07-21 DIAGNOSIS — D485 Neoplasm of uncertain behavior of skin: Secondary | ICD-10-CM | POA: Diagnosis not present

## 2018-07-21 DIAGNOSIS — L570 Actinic keratosis: Secondary | ICD-10-CM | POA: Diagnosis not present

## 2018-08-18 DIAGNOSIS — C4441 Basal cell carcinoma of skin of scalp and neck: Secondary | ICD-10-CM | POA: Diagnosis not present

## 2018-08-18 DIAGNOSIS — C4491 Basal cell carcinoma of skin, unspecified: Secondary | ICD-10-CM | POA: Diagnosis not present

## 2018-09-01 DIAGNOSIS — C4441 Basal cell carcinoma of skin of scalp and neck: Secondary | ICD-10-CM | POA: Diagnosis not present

## 2018-09-15 DIAGNOSIS — L03312 Cellulitis of back [any part except buttock]: Secondary | ICD-10-CM | POA: Diagnosis not present

## 2018-09-23 ENCOUNTER — Ambulatory Visit (INDEPENDENT_AMBULATORY_CARE_PROVIDER_SITE_OTHER): Payer: PPO | Admitting: Internal Medicine

## 2018-09-23 ENCOUNTER — Other Ambulatory Visit: Payer: Self-pay

## 2018-09-23 ENCOUNTER — Encounter: Payer: Self-pay | Admitting: Internal Medicine

## 2018-09-23 VITALS — BP 132/80 | HR 74 | Temp 98.0°F | Ht 67.0 in | Wt 165.0 lb

## 2018-09-23 DIAGNOSIS — Z1329 Encounter for screening for other suspected endocrine disorder: Secondary | ICD-10-CM

## 2018-09-23 DIAGNOSIS — Z13220 Encounter for screening for lipoid disorders: Secondary | ICD-10-CM | POA: Diagnosis not present

## 2018-09-23 DIAGNOSIS — E559 Vitamin D deficiency, unspecified: Secondary | ICD-10-CM | POA: Diagnosis not present

## 2018-09-23 DIAGNOSIS — Z1389 Encounter for screening for other disorder: Secondary | ICD-10-CM

## 2018-09-23 DIAGNOSIS — Z0000 Encounter for general adult medical examination without abnormal findings: Secondary | ICD-10-CM

## 2018-09-23 DIAGNOSIS — R7303 Prediabetes: Secondary | ICD-10-CM | POA: Diagnosis not present

## 2018-09-23 DIAGNOSIS — Z125 Encounter for screening for malignant neoplasm of prostate: Secondary | ICD-10-CM | POA: Diagnosis not present

## 2018-09-23 DIAGNOSIS — Z85828 Personal history of other malignant neoplasm of skin: Secondary | ICD-10-CM
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
## 2018-09-23 NOTE — Patient Instructions (Signed)
prevnar vaccine consider this in the future if you have not had  Debrox ear drops in left ear 4-7 days as needed can do this monthly for ears    Pneumococcal Conjugate Vaccine suspension for injection What is this medicine? PNEUMOCOCCAL VACCINE (NEU mo KOK al vak SEEN) is a vaccine used to prevent pneumococcus bacterial infections. These bacteria can cause serious infections like pneumonia, meningitis, and blood infections. This vaccine will lower your chance of getting pneumonia. If you do get pneumonia, it can make your symptoms milder and your illness shorter. This vaccine will not treat an infection and will not cause infection. This vaccine is recommended for infants and young children, adults with certain medical conditions, and adults 56 years or older. This medicine may be used for other purposes; ask your health care provider or pharmacist if you have questions. COMMON BRAND NAME(S): Prevnar, Prevnar 13 What should I tell my health care provider before I take this medicine? They need to know if you have any of these conditions: -bleeding problems -fever -immune system problems -an unusual or allergic reaction to pneumococcal vaccine, diphtheria toxoid, other vaccines, latex, other medicines, foods, dyes, or preservatives -pregnant or trying to get pregnant -breast-feeding How should I use this medicine? This vaccine is for injection into a muscle. It is given by a health care professional. A copy of Vaccine Information Statements will be given before each vaccination. Read this sheet carefully each time. The sheet may change frequently. Talk to your pediatrician regarding the use of this medicine in children. While this drug may be prescribed for children as young as 84 weeks old for selected conditions, precautions do apply. Overdosage: If you think you have taken too much of this medicine contact a poison control center or emergency room at once. NOTE: This medicine is only for you.  Do not share this medicine with others. What if I miss a dose? It is important not to miss your dose. Call your doctor or health care professional if you are unable to keep an appointment. What may interact with this medicine? -medicines for cancer chemotherapy -medicines that suppress your immune function -steroid medicines like prednisone or cortisone This list may not describe all possible interactions. Give your health care provider a list of all the medicines, herbs, non-prescription drugs, or dietary supplements you use. Also tell them if you smoke, drink alcohol, or use illegal drugs. Some items may interact with your medicine. What should I watch for while using this medicine? Mild fever and pain should go away in 3 days or less. Report any unusual symptoms to your doctor or health care professional. What side effects may I notice from receiving this medicine? Side effects that you should report to your doctor or health care professional as soon as possible: -allergic reactions like skin rash, itching or hives, swelling of the face, lips, or tongue -breathing problems -confused -fast or irregular heartbeat -fever over 102 degrees F -seizures -unusual bleeding or bruising -unusual muscle weakness Side effects that usually do not require medical attention (report to your doctor or health care professional if they continue or are bothersome): -aches and pains -diarrhea -fever of 102 degrees F or less -headache -irritable -loss of appetite -pain, tender at site where injected -trouble sleeping This list may not describe all possible side effects. Call your doctor for medical advice about side effects. You may report side effects to FDA at 1-800-FDA-1088. Where should I keep my medicine? This does not apply. This vaccine is  given in a clinic, pharmacy, doctor's office, or other health care setting and will not be stored at home. NOTE: This sheet is a summary. It may not cover all  possible information. If you have questions about this medicine, talk to your doctor, pharmacist, or health care provider.  2019 Elsevier/Gold Standard (2014-04-01 10:27:27)   Earwax Buildup, Adult The ears produce a substance called earwax that helps keep bacteria out of the ear and protects the skin in the ear canal. Occasionally, earwax can build up in the ear and cause discomfort or hearing loss. What increases the risk? This condition is more likely to develop in people who:  Are male.  Are elderly.  Naturally produce more earwax.  Clean their ears often with cotton swabs.  Use earplugs often.  Use in-ear headphones often.  Wear hearing aids.  Have narrow ear canals.  Have earwax that is overly thick or sticky.  Have eczema.  Are dehydrated.  Have excess hair in the ear canal. What are the signs or symptoms? Symptoms of this condition include:  Reduced or muffled hearing.  A feeling of fullness in the ear or feeling that the ear is plugged.  Fluid coming from the ear.  Ear pain.  Ear itch.  Ringing in the ear.  Coughing.  An obvious piece of earwax that can be seen inside the ear canal. How is this diagnosed? This condition may be diagnosed based on:  Your symptoms.  Your medical history.  An ear exam. During the exam, your health care provider will look into your ear with an instrument called an otoscope. You may have tests, including a hearing test. How is this treated? This condition may be treated by:  Using ear drops to soften the earwax.  Having the earwax removed by a health care provider. The health care provider may: ? Flush the ear with water. ? Use an instrument that has a loop on the end (curette). ? Use a suction device.  Surgery to remove the wax buildup. This may be done in severe cases. Follow these instructions at home:   Take over-the-counter and prescription medicines only as told by your health care provider.  Do not  put any objects, including cotton swabs, into your ear. You can clean the opening of your ear canal with a washcloth or facial tissue.  Follow instructions from your health care provider about cleaning your ears. Do not over-clean your ears.  Drink enough fluid to keep your urine clear or pale yellow. This will help to thin the earwax.  Keep all follow-up visits as told by your health care provider. If earwax builds up in your ears often or if you use hearing aids, consider seeing your health care provider for routine, preventive ear cleanings. Ask your health care provider how often you should schedule your cleanings.  If you have hearing aids, clean them according to instructions from the manufacturer and your health care provider. Contact a health care provider if:  You have ear pain.  You develop a fever.  You have blood, pus, or other fluid coming from your ear.  You have hearing loss.  You have ringing in your ears that does not go away.  Your symptoms do not improve with treatment.  You feel like the room is spinning (vertigo). Summary  Earwax can build up in the ear and cause discomfort or hearing loss.  The most common symptoms of this condition include reduced or muffled hearing and a feeling of fullness  in the ear or feeling that the ear is plugged.  This condition may be diagnosed based on your symptoms, your medical history, and an ear exam.  This condition may be treated by using ear drops to soften the earwax or by having the earwax removed by a health care provider.  Do not put any objects, including cotton swabs, into your ear. You can clean the opening of your ear canal with a washcloth or facial tissue. This information is not intended to replace advice given to you by your health care provider. Make sure you discuss any questions you have with your health care provider. Document Released: 08/02/2004 Document Revised: 06/06/2017 Document Reviewed: 09/05/2016  Elsevier Interactive Patient Education  2019 [employer].

## 2019-10-15 ENCOUNTER — Ambulatory Visit (INDEPENDENT_AMBULATORY_CARE_PROVIDER_SITE_OTHER): Payer: PPO

## 2019-10-15 ENCOUNTER — Encounter (INDEPENDENT_AMBULATORY_CARE_PROVIDER_SITE_OTHER): Payer: Self-pay

## 2019-10-15 ENCOUNTER — Telehealth: Payer: Self-pay

## 2019-10-15 VITALS — Ht 67.5 in | Wt 165.0 lb

## 2019-10-15 DIAGNOSIS — Z0000 Encounter for general adult medical examination without abnormal findings: Secondary | ICD-10-CM | POA: Diagnosis not present

## 2019-10-15 NOTE — Patient Instructions (Addendum)
  Mr. Boerman , Thank you for taking time to come for your Medicare Wellness Visit. I appreciate your ongoing commitment to your health goals. Please review the following plan we discussed and let me know if I can assist you in the future.   These are the goals we discussed: Goals      Patient Stated   . DIET - REDUCE SUGAR INTAKE (pt-stated)     Increase water intake    . I would like to do more gym exercises and/or use stationary bike (pt-stated)       This is a list of the screening recommended for you and due dates:  Health Maintenance  Topic Date Due  . Pneumonia vaccines (1 of 2 - PCV13) Never done  . Flu Shot  02/07/2020  . Tetanus Vaccine  06/23/2028
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
## 2019-10-15 NOTE — Telephone Encounter (Signed)
Failed to reach patient for scheduled awv.  Left message to call the office back within allotted timeframe or reschedule.

## 2019-11-26 DIAGNOSIS — H2513 Age-related nuclear cataract, bilateral: Secondary | ICD-10-CM | POA: Diagnosis not present

## 2020-01-14 ENCOUNTER — Other Ambulatory Visit: Payer: Self-pay

## 2020-01-14 ENCOUNTER — Ambulatory Visit (INDEPENDENT_AMBULATORY_CARE_PROVIDER_SITE_OTHER): Payer: PPO

## 2020-01-14 ENCOUNTER — Encounter: Payer: Self-pay | Admitting: Internal Medicine

## 2020-01-14 ENCOUNTER — Ambulatory Visit (INDEPENDENT_AMBULATORY_CARE_PROVIDER_SITE_OTHER): Payer: PPO | Admitting: Internal Medicine

## 2020-01-14 ENCOUNTER — Telehealth: Payer: Self-pay | Admitting: Internal Medicine

## 2020-01-14 VITALS — BP 116/82 | HR 71 | Temp 98.0°F | Ht 67.5 in | Wt 166.4 lb

## 2020-01-14 DIAGNOSIS — Z23 Encounter for immunization: Secondary | ICD-10-CM | POA: Diagnosis not present

## 2020-01-14 DIAGNOSIS — L570 Actinic keratosis: Secondary | ICD-10-CM

## 2020-01-14 DIAGNOSIS — Z125 Encounter for screening for malignant neoplasm of prostate: Secondary | ICD-10-CM

## 2020-01-14 DIAGNOSIS — E559 Vitamin D deficiency, unspecified: Secondary | ICD-10-CM | POA: Diagnosis not present

## 2020-01-14 DIAGNOSIS — N400 Enlarged prostate without lower urinary tract symptoms: Secondary | ICD-10-CM

## 2020-01-14 DIAGNOSIS — M545 Low back pain, unspecified: Secondary | ICD-10-CM

## 2020-01-14 DIAGNOSIS — R5383 Other fatigue: Secondary | ICD-10-CM

## 2020-01-14 DIAGNOSIS — Z720 Tobacco use: Secondary | ICD-10-CM

## 2020-01-14 DIAGNOSIS — R7989 Other specified abnormal findings of blood chemistry: Secondary | ICD-10-CM | POA: Diagnosis not present

## 2020-01-14 DIAGNOSIS — Z1329 Encounter for screening for other suspected endocrine disorder: Secondary | ICD-10-CM | POA: Diagnosis not present

## 2020-01-14 DIAGNOSIS — E538 Deficiency of other specified B group vitamins: Secondary | ICD-10-CM

## 2020-01-14 DIAGNOSIS — Z13818 Encounter for screening for other digestive system disorders: Secondary | ICD-10-CM

## 2020-01-14 DIAGNOSIS — R7303 Prediabetes: Secondary | ICD-10-CM

## 2020-01-14 DIAGNOSIS — Z13220 Encounter for screening for lipoid disorders: Secondary | ICD-10-CM | POA: Diagnosis not present

## 2020-01-14 DIAGNOSIS — H6192 Disorder of left external ear, unspecified: Secondary | ICD-10-CM

## 2020-01-14 DIAGNOSIS — Z1283 Encounter for screening for malignant neoplasm of skin: Secondary | ICD-10-CM | POA: Diagnosis not present

## 2020-01-14 DIAGNOSIS — H6123 Impacted cerumen, bilateral: Secondary | ICD-10-CM

## 2020-01-14 DIAGNOSIS — H9392 Unspecified disorder of left ear: Secondary | ICD-10-CM

## 2020-01-14 NOTE — Telephone Encounter (Signed)
Need records eye exam evidence of cataracts  eye?  Thanks [employer] [facility]
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
## 2020-01-14 NOTE — Patient Instructions (Addendum)
Consider MRI low back  Physical therapy  Vitamin D3 2000 IU daily   Low Back Sprain or Strain Rehab Ask your health care provider which exercises are safe for you. Do exercises exactly as told by your health care provider and adjust them as directed. It is normal to feel mild stretching, pulling, tightness, or discomfort as you do these exercises. Stop right away if you feel sudden pain or your pain gets worse. Do not begin these exercises until told by your health care provider. Stretching and range-of-motion exercises These exercises warm up your muscles and joints and improve the movement and flexibility of your back. These exercises also help to relieve pain, numbness, and tingling. Lumbar rotation  1. Lie on your back on a firm surface and bend your knees. 2. Straighten your arms out to your sides so each arm forms a 90-degree angle (right angle) with a side of your body. 3. Slowly move (rotate) both of your knees to one side of your body until you feel a stretch in your lower back (lumbar). Try not to let your shoulders lift off the floor. 4. Hold this position for __________ seconds. 5. Tense your abdominal muscles and slowly move your knees back to the starting position. 6. Repeat this exercise on the other side of your body. Repeat __________ times. Complete this exercise __________ times a day. Single knee to chest  1. Lie on your back on a firm surface with both legs straight. 2. Bend one of your knees. Use your hands to move your knee up toward your chest until you feel a gentle stretch in your lower back and buttock. ? Hold your leg in this position by holding on to the front of your knee. ? Keep your other leg as straight as possible. 3. Hold this position for __________ seconds. 4. Slowly return to the starting position. 5. Repeat with your other leg. Repeat __________ times. Complete this exercise __________ times a day. Prone extension on elbows  1. Lie on your abdomen  on a firm surface (prone position). 2. Prop yourself up on your elbows. 3. Use your arms to help lift your chest up until you feel a gentle stretch in your abdomen and your lower back. ? This will place some of your body weight on your elbows. If this is uncomfortable, try stacking pillows under your chest. ? Your hips should stay down, against the surface that you are lying on. Keep your hip and back muscles relaxed. 4. Hold this position for __________ seconds. 5. Slowly relax your upper body and return to the starting position. Repeat __________ times. Complete this exercise __________ times a day. Strengthening exercises These exercises build strength and endurance in your back. Endurance is the ability to use your muscles for a long time, even after they get tired. Pelvic tilt This exercise strengthens the muscles that lie deep in the abdomen. 1. Lie on your back on a firm surface. Bend your knees and keep your feet flat on the floor. 2. Tense your abdominal muscles. Tip your pelvis up toward the ceiling and flatten your lower back into the floor. ? To help with this exercise, you may place a small towel under your lower back and try to push your back into the towel. 3. Hold this position for __________ seconds. 4. Let your muscles relax completely before you repeat this exercise. Repeat __________ times. Complete this exercise __________ times a day. Alternating arm and leg raises  1. Get on your  hands and knees on a firm surface. If you are on a hard floor, you may want to use padding, such as an exercise mat, to cushion your knees. 2. Line up your arms and legs. Your hands should be directly below your shoulders, and your knees should be directly below your hips. 3. Lift your left leg behind you. At the same time, raise your right arm and straighten it in front of you. ? Do not lift your leg higher than your hip. ? Do not lift your arm higher than your shoulder. ? Keep your abdominal  and back muscles tight. ? Keep your hips facing the ground. ? Do not arch your back. ? Keep your balance carefully, and do not hold your breath. 4. Hold this position for __________ seconds. 5. Slowly return to the starting position. 6. Repeat with your right leg and your left arm. Repeat __________ times. Complete this exercise __________ times a day. Abdominal set with straight leg raise  1. Lie on your back on a firm surface. 2. Bend one of your knees and keep your other leg straight. 3. Tense your abdominal muscles and lift your straight leg up, 4-6 inches (10-15 cm) off the ground. 4. Keep your abdominal muscles tight and hold this position for __________ seconds. ? Do not hold your breath. ? Do not arch your back. Keep it flat against the ground. 5. Keep your abdominal muscles tense as you slowly lower your leg back to the starting position. 6. Repeat with your other leg. Repeat __________ times. Complete this exercise __________ times a day. Single leg lower with bent knees 1. Lie on your back on a firm surface. 2. Tense your abdominal muscles and lift your feet off the floor, one foot at a time, so your knees and hips are bent in 90-degree angles (right angles). ? Your knees should be over your hips and your lower legs should be parallel to the floor. 3. Keeping your abdominal muscles tense and your knee bent, slowly lower one of your legs so your toe touches the ground. 4. Lift your leg back up to return to the starting position. ? Do not hold your breath. ? Do not let your back arch. Keep your back flat against the ground. 5. Repeat with your other leg. Repeat __________ times. Complete this exercise __________ times a day. Posture and body mechanics Good posture and healthy body mechanics can help to relieve stress in your body's tissues and joints. Body mechanics refers to the movements and positions of your body while you do your daily activities. Posture is part of body  mechanics. Good posture means:  Your spine is in its natural S-curve position (neutral).  Your shoulders are pulled back slightly.  Your head is not tipped forward. Follow these guidelines to improve your posture and body mechanics in your everyday activities. Standing   When standing, keep your spine neutral and your feet about hip width apart. Keep a slight bend in your knees. Your ears, shoulders, and hips should line up.  When you do a task in which you stand in one place for a long time, place one foot up on a stable object that is 2-4 inches (5-10 cm) high, such as a footstool. This helps keep your spine neutral. Sitting   When sitting, keep your spine neutral and keep your feet flat on the floor. Use a footrest, if necessary, and keep your thighs parallel to the floor. Avoid rounding your shoulders, and avoid tilting  your head forward.  When working at a desk or a computer, keep your desk at a height where your hands are slightly lower than your elbows. Slide your chair under your desk so you are close enough to maintain good posture.  When working at a computer, place your monitor at a height where you are looking straight ahead and you do not have to tilt your head forward or downward to look at the screen. Resting  When lying down and resting, avoid positions that are most painful for you.  If you have pain with activities such as sitting, bending, stooping, or squatting, lie in a position in which your body does not bend very much. For example, avoid curling up on your side with your arms and knees near your chest (fetal position).  If you have pain with activities such as standing for a long time or reaching with your arms, lie with your spine in a neutral position and bend your knees slightly. Try the following positions: ? Lying on your side with a pillow between your knees. ? Lying on your back with a pillow under your knees. Lifting   When lifting objects, keep your  feet at least shoulder width apart and tighten your abdominal muscles.  Bend your knees and hips and keep your spine neutral. It is important to lift using the strength of your legs, not your back. Do not lock your knees straight out.  Always ask for help to lift heavy or awkward objects. This information is not intended to replace advice given to you by your health care provider. Make sure you discuss any questions you have with your health care provider. Document Revised: 10/17/2018 Document Reviewed: 07/17/2018 Elsevier Patient Education  [place].  Back Exercises The following exercises strengthen the muscles that help to support the trunk and back. They also help to keep the lower back flexible. Doing these exercises can help to prevent back pain or lessen existing pain.  If you have back pain or discomfort, try doing these exercises 2-3 times each day or as told by your health care provider.  As your pain improves, do them once each day, but increase the number of times that you repeat the steps for each exercise (do more repetitions).  To prevent the recurrence of back pain, continue to do these exercises once each day or as told by your health care provider. Do exercises exactly as told by your health care provider and adjust them as directed. It is normal to feel mild stretching, pulling, tightness, or discomfort as you do these exercises, but you should stop right away if you feel sudden pain or your pain gets worse. Exercises Single knee to chest Repeat these steps 3-5 times for each leg: 1. Lie on your back on a firm bed or the floor with your legs extended. 2. Bring one knee to your chest. Your other leg should stay extended and in contact with the floor. 3. Hold your knee in place by grabbing your knee or thigh with both hands and hold. 4. Pull on your knee until you feel a gentle stretch in your lower back or buttocks. 5. Hold the stretch for 10-30  seconds. 6. Slowly release and straighten your leg. Pelvic tilt Repeat these steps 5-10 times: 1. Lie on your back on a firm bed or the floor with your legs extended. 2. Bend your knees so they are pointing toward the ceiling and your feet are flat on the floor.  3. Tighten your lower abdominal muscles to press your lower back against the floor. This motion will tilt your pelvis so your tailbone points up toward the ceiling instead of pointing to your feet or the floor. 4. With gentle tension and even breathing, hold this position for 5-10 seconds. Cat-cow Repeat these steps until your lower back becomes more flexible: 1. Get into a hands-and-knees position on a firm surface. Keep your hands under your shoulders, and keep your knees under your hips. You may place padding under your knees for comfort. 2. Let your head hang down toward your chest. Contract your abdominal muscles and point your tailbone toward the floor so your lower back becomes rounded like the back of a cat. 3. Hold this position for 5 seconds. 4. Slowly lift your head, let your abdominal muscles relax and point your tailbone up toward the ceiling so your back forms a sagging arch like the back of a cow. 5. Hold this position for 5 seconds.  Press-ups Repeat these steps 5-10 times: 1. Lie on your abdomen (face-down) on the floor. 2. Place your palms near your head, about shoulder-width apart. 3. Keeping your back as relaxed as possible and keeping your hips on the floor, slowly straighten your arms to raise the top half of your body and lift your shoulders. Do not use your back muscles to raise your upper torso. You may adjust the placement of your hands to make yourself more comfortable. 4. Hold this position for 5 seconds while you keep your back relaxed. 5. Slowly return to lying flat on the floor.  Bridges Repeat these steps 10 times: 1. Lie on your back on a firm surface. 2. Bend your knees so they are pointing toward  the ceiling and your feet are flat on the floor. Your arms should be flat at your sides, next to your body. 3. Tighten your buttocks muscles and lift your buttocks off the floor until your waist is at almost the same height as your knees. You should feel the muscles working in your buttocks and the back of your thighs. If you do not feel these muscles, slide your feet 1-2 inches farther away from your buttocks. 4. Hold this position for 3-5 seconds. 5. Slowly lower your hips to the starting position, and allow your buttocks muscles to relax completely. If this exercise is too easy, try doing it with your arms crossed over your chest. Abdominal crunches Repeat these steps 5-10 times: 1. Lie on your back on a firm bed or the floor with your legs extended. 2. Bend your knees so they are pointing toward the ceiling and your feet are flat on the floor. 3. Cross your arms over your chest. 4. Tip your chin slightly toward your chest without bending your neck. 5. Tighten your abdominal muscles and slowly raise your trunk (torso) high enough to lift your shoulder blades a tiny bit off the floor. Avoid raising your torso higher than that because it can put too much stress on your low back and does not help to strengthen your abdominal muscles. 6. Slowly return to your starting position. Back lifts Repeat these steps 5-10 times: 1. Lie on your abdomen (face-down) with your arms at your sides, and rest your forehead on the floor. 2. Tighten the muscles in your legs and your buttocks. 3. Slowly lift your chest off the floor while you keep your hips pressed to the floor. Keep the back of your head in line with the curve  in your back. Your eyes should be looking at the floor. 4. Hold this position for 3-5 seconds. 5. Slowly return to your starting position. Contact a health care provider if:  Your back pain or discomfort gets much worse when you do an exercise.  Your worsening back pain or discomfort does  not lessen within 2 hours after you exercise. If you have any of these problems, stop doing these exercises right away. Do not do them again unless your health care provider says that you can. Get help right away if:  You develop sudden, severe back pain. If this happens, stop doing the exercises right away. Do not do them again unless your health care provider says that you can. This information is not intended to replace advice given to you by your health care provider. Make sure you discuss any questions you have with your health care provider. Document Revised: 10/30/2018 Document Reviewed: 03/27/2018 Elsevier Patient Education  [place].

## 2020-01-15 ENCOUNTER — Telehealth: Payer: Self-pay | Admitting: Internal Medicine

## 2020-01-15 ENCOUNTER — Other Ambulatory Visit (INDEPENDENT_AMBULATORY_CARE_PROVIDER_SITE_OTHER): Payer: PPO

## 2020-01-15 ENCOUNTER — Encounter: Payer: Self-pay | Admitting: Internal Medicine

## 2020-01-15 DIAGNOSIS — R5383 Other fatigue: Secondary | ICD-10-CM | POA: Diagnosis not present

## 2020-01-15 DIAGNOSIS — Z13818 Encounter for screening for other digestive system disorders: Secondary | ICD-10-CM | POA: Diagnosis not present

## 2020-01-15 DIAGNOSIS — R7989 Other specified abnormal findings of blood chemistry: Secondary | ICD-10-CM | POA: Diagnosis not present

## 2020-01-15 DIAGNOSIS — Z1329 Encounter for screening for other suspected endocrine disorder: Secondary | ICD-10-CM | POA: Diagnosis not present

## 2020-01-15 DIAGNOSIS — R7303 Prediabetes: Secondary | ICD-10-CM

## 2020-01-15 DIAGNOSIS — Z125 Encounter for screening for malignant neoplasm of prostate: Secondary | ICD-10-CM | POA: Diagnosis not present

## 2020-01-15 DIAGNOSIS — E559 Vitamin D deficiency, unspecified: Secondary | ICD-10-CM | POA: Diagnosis not present

## 2020-01-15 DIAGNOSIS — E538 Deficiency of other specified B group vitamins: Secondary | ICD-10-CM

## 2020-01-15 DIAGNOSIS — Z13220 Encounter for screening for lipoid disorders: Secondary | ICD-10-CM

## 2020-01-15 DIAGNOSIS — I700 Atherosclerosis of aorta: Secondary | ICD-10-CM | POA: Insufficient documentation

## 2020-01-15 DIAGNOSIS — N400 Enlarged prostate without lower urinary tract symptoms: Secondary | ICD-10-CM

## 2020-01-15 LAB — CBC WITH DIFFERENTIAL/PLATELET
Basophils Absolute: 0 10*3/uL (ref 0.0–0.1)
Basophils Relative: 0.3 % (ref 0.0–3.0)
Eosinophils Absolute: 0.5 10*3/uL (ref 0.0–0.7)
Eosinophils Relative: 6.5 % — ABNORMAL HIGH (ref 0.0–5.0)
HCT: 47.8 % (ref 39.0–52.0)
Hemoglobin: 16.2 g/dL (ref 13.0–17.0)
Lymphocytes Relative: 33.7 % (ref 12.0–46.0)
Lymphs Abs: 2.7 10*3/uL (ref 0.7–4.0)
MCHC: 33.9 g/dL (ref 30.0–36.0)
MCV: 94 fl (ref 78.0–100.0)
Monocytes Absolute: 0.5 10*3/uL (ref 0.1–1.0)
Monocytes Relative: 6.8 % (ref 3.0–12.0)
Neutro Abs: 4.3 10*3/uL (ref 1.4–7.7)
Neutrophils Relative %: 52.7 % (ref 43.0–77.0)
Platelets: 194 10*3/uL (ref 150.0–400.0)
RBC: 5.09 Mil/uL (ref 4.22–5.81)
RDW: 13.4 % (ref 11.5–15.5)
WBC: 8.1 10*3/uL (ref 4.0–10.5)

## 2020-01-15 LAB — COMPREHENSIVE METABOLIC PANEL
ALT: 47 U/L (ref 0–53)
AST: 28 U/L (ref 0–37)
Albumin: 4.2 g/dL (ref 3.5–5.2)
Alkaline Phosphatase: 69 U/L (ref 39–117)
BUN: 12 mg/dL (ref 6–23)
CO2: 27 mEq/L (ref 19–32)
Calcium: 9.5 mg/dL (ref 8.4–10.5)
Chloride: 103 mEq/L (ref 96–112)
Creatinine, Ser: 1.03 mg/dL (ref 0.40–1.50)
GFR: 70.03 mL/min (ref >60.00)
Glucose, Bld: 123 mg/dL — ABNORMAL HIGH (ref 70–99)
Potassium: 4.4 mEq/L (ref 3.5–5.1)
Sodium: 138 mEq/L (ref 135–145)
Total Bilirubin: 1.3 mg/dL — ABNORMAL HIGH (ref 0.2–1.2)
Total Protein: 6.3 g/dL (ref 6.0–8.3)

## 2020-01-15 LAB — LIPID PANEL
Cholesterol: 146 mg/dL (ref 0–200)
HDL: 45.3 mg/dL (ref >39.00)
LDL Cholesterol: 76 mg/dL (ref 0–99)
NonHDL: 100.36
Total CHOL/HDL Ratio: 3
Triglycerides: 121 mg/dL (ref 0.0–149.0)
VLDL: 24.2 mg/dL (ref 0.0–40.0)

## 2020-01-15 LAB — TSH: TSH: 1.42 u[IU]/mL (ref 0.35–4.50)

## 2020-01-15 LAB — PSA: PSA: 2.65 ng/mL (ref 0.10–4.00)

## 2020-01-15 LAB — VITAMIN B12: Vitamin B-12: 269 pg/mL (ref 211–911)

## 2020-01-15 LAB — HEMOGLOBIN A1C: Hgb A1c MFr Bld: 5.9 % (ref 4.6–6.5)

## 2020-01-15 LAB — VITAMIN D 25 HYDROXY (VIT D DEFICIENCY, FRACTURES): VITD: 31.35 ng/mL (ref 30.00–100.00)

## 2020-01-15 LAB — TESTOSTERONE: Testosterone: 284.99 ng/dL — ABNORMAL LOW (ref 300.00–890.00)

## 2020-01-15 NOTE — Telephone Encounter (Signed)
CD will be made! Ill bring it over once done

## 2020-01-15 NOTE — Telephone Encounter (Signed)
Sent to fax.

## 2020-01-15 NOTE — Telephone Encounter (Signed)
Patient is requesting a copy of his x-rays done 01/14/20 on a CD.   Please advise

## 2020-01-16 LAB — URINALYSIS, ROUTINE W REFLEX MICROSCOPIC
Bacteria, UA: NONE SEEN /HPF
Bilirubin Urine: NEGATIVE
Glucose, UA: NEGATIVE
Hgb urine dipstick: NEGATIVE
Hyaline Cast: NONE SEEN /LPF
Ketones, ur: NEGATIVE
Nitrite: NEGATIVE
Protein, ur: NEGATIVE
RBC / HPF: NONE SEEN /HPF (ref 0–2)
Specific Gravity, Urine: 1.014 (ref 1.001–1.03)
Squamous Epithelial / HPF: NONE SEEN /HPF (ref <=5)
WBC, UA: NONE SEEN /HPF (ref 0–5)
pH: 5.5 (ref 5.0–8.0)

## 2020-01-18 ENCOUNTER — Telehealth: Payer: Self-pay | Admitting: Internal Medicine

## 2020-01-18 LAB — HEPATITIS C ANTIBODY
Hepatitis C Ab: NONREACTIVE
SIGNAL TO CUT-OFF: 0 (ref <1.00)

## 2020-01-18 NOTE — Telephone Encounter (Signed)
Patient has been informed and disk has been placed up front for pick up

## 2020-01-18 NOTE — Telephone Encounter (Signed)
CD done.  Left message to return call.

## 2020-01-18 NOTE — Telephone Encounter (Signed)
Patient informed to come and pick up

## 2020-01-18 NOTE — Telephone Encounter (Signed)
Patient is calling for his lab results. He also needs a disc of his last x-ray of the chest and spine.

## 2020-01-19 ENCOUNTER — Telehealth: Payer: Self-pay | Admitting: Internal Medicine

## 2020-01-19 NOTE — Telephone Encounter (Signed)
This needs to come from urology does he wand referral?  PCP does not Rx testosterone

## 2020-01-19 NOTE — Telephone Encounter (Signed)
Labs sent via result note routing

## 2020-01-19 NOTE — Telephone Encounter (Signed)
Please advise 

## 2020-01-19 NOTE — Telephone Encounter (Signed)
Spoke with the Patient's wife and she states that he see's Dr Yves Dill urology. They will contact their office

## 2020-01-19 NOTE — Telephone Encounter (Signed)
Fax labs to Dr. Rogers Blocker urology   [place]

## 2020-01-19 NOTE — Telephone Encounter (Signed)
Pt would like Androgel 1.62% called in for his low Testerone

## 2020-01-20 ENCOUNTER — Other Ambulatory Visit: Payer: Self-pay | Admitting: Internal Medicine

## 2020-01-20 DIAGNOSIS — M810 Age-related osteoporosis without current pathological fracture: Secondary | ICD-10-CM

## 2020-01-20 DIAGNOSIS — R17 Unspecified jaundice: Secondary | ICD-10-CM

## 2020-01-20 DIAGNOSIS — S32010A Wedge compression fracture of first lumbar vertebra, initial encounter for closed fracture: Secondary | ICD-10-CM

## 2020-01-27 ENCOUNTER — Other Ambulatory Visit: Payer: PPO

## 2020-01-27 DIAGNOSIS — D485 Neoplasm of uncertain behavior of skin: Secondary | ICD-10-CM | POA: Diagnosis not present

## 2020-01-27 DIAGNOSIS — L821 Other seborrheic keratosis: Secondary | ICD-10-CM | POA: Diagnosis not present

## 2020-01-27 DIAGNOSIS — D0422 Carcinoma in situ of skin of left ear and external auricular canal: Secondary | ICD-10-CM | POA: Diagnosis not present

## 2020-02-04 NOTE — Telephone Encounter (Signed)
Received

## 2020-02-10 ENCOUNTER — Encounter: Payer: Self-pay | Admitting: Internal Medicine

## 2020-02-10 ENCOUNTER — Ambulatory Visit
Admission: RE | Admit: 2020-02-10 | Discharge: 2020-02-10 | Disposition: A | Discharge status: Home / Self Care | Payer: PPO | Source: Ambulatory Visit | Attending: Internal Medicine | Admitting: Internal Medicine

## 2020-02-10 ENCOUNTER — Other Ambulatory Visit: Payer: Self-pay

## 2020-02-10 DIAGNOSIS — M810 Age-related osteoporosis without current pathological fracture: Secondary | ICD-10-CM | POA: Diagnosis not present

## 2020-02-10 DIAGNOSIS — S32010A Wedge compression fracture of first lumbar vertebra, initial encounter for closed fracture: Secondary | ICD-10-CM | POA: Insufficient documentation

## 2020-02-10 DIAGNOSIS — M8589 Other specified disorders of bone density and structure, multiple sites: Secondary | ICD-10-CM | POA: Diagnosis not present

## 2020-02-10 DIAGNOSIS — Z780 Asymptomatic menopausal state: Secondary | ICD-10-CM | POA: Diagnosis not present

## 2020-02-10 DIAGNOSIS — M8580 Other specified disorders of bone density and structure, unspecified site: Secondary | ICD-10-CM | POA: Insufficient documentation

## 2020-02-18 DIAGNOSIS — C44229 Squamous cell carcinoma of skin of left ear and external auricular canal: Secondary | ICD-10-CM | POA: Diagnosis not present

## 2020-02-23 DIAGNOSIS — Z79899 Other long term (current) drug therapy: Secondary | ICD-10-CM | POA: Diagnosis not present

## 2020-02-23 DIAGNOSIS — N401 Enlarged prostate with lower urinary tract symptoms: Secondary | ICD-10-CM | POA: Diagnosis not present

## 2020-02-23 DIAGNOSIS — E291 Testicular hypofunction: Secondary | ICD-10-CM | POA: Diagnosis not present

## 2020-04-15 ENCOUNTER — Other Ambulatory Visit: Payer: Self-pay

## 2020-04-15 ENCOUNTER — Encounter: Payer: Self-pay | Admitting: Internal Medicine

## 2020-04-15 ENCOUNTER — Ambulatory Visit (INDEPENDENT_AMBULATORY_CARE_PROVIDER_SITE_OTHER): Payer: PPO | Admitting: Internal Medicine

## 2020-04-15 VITALS — BP 130/86 | HR 78 | Temp 97.7°F | Ht 67.5 in | Wt 165.6 lb

## 2020-04-15 DIAGNOSIS — M8580 Other specified disorders of bone density and structure, unspecified site: Secondary | ICD-10-CM

## 2020-04-15 DIAGNOSIS — S32010A Wedge compression fracture of first lumbar vertebra, initial encounter for closed fracture: Secondary | ICD-10-CM | POA: Insufficient documentation

## 2020-04-15 DIAGNOSIS — R030 Elevated blood-pressure reading, without diagnosis of hypertension: Secondary | ICD-10-CM

## 2020-04-15 DIAGNOSIS — E663 Overweight: Secondary | ICD-10-CM | POA: Diagnosis not present

## 2020-04-15 DIAGNOSIS — E291 Testicular hypofunction: Secondary | ICD-10-CM | POA: Diagnosis not present

## 2020-04-15 DIAGNOSIS — M5136 Other intervertebral disc degeneration, lumbar region: Secondary | ICD-10-CM | POA: Diagnosis not present

## 2020-04-15 DIAGNOSIS — C44229 Squamous cell carcinoma of skin of left ear and external auricular canal: Secondary | ICD-10-CM

## 2020-04-15 DIAGNOSIS — R7303 Prediabetes: Secondary | ICD-10-CM | POA: Diagnosis not present

## 2020-04-15 HISTORY — DX: Wedge compression fracture of first lumbar vertebra, initial encounter for closed fracture: S32.010A

## 2020-04-15 NOTE — Patient Instructions (Addendum)
Vitamin D3 2000 IU daily, calcium 600 mg 1-2x daily  Vaccine due 6-8 months from 09/21/19   Monitor BP goal ,130/<80 call if not controlled Osteopenia  Osteopenia is a loss of thickness (density) inside of the bones. Another name for osteopenia is low bone mass. Mild osteopenia is a normal part of aging. It is not a disease, and it does not cause symptoms. However, if you have osteopenia and continue to lose bone mass, you could develop a condition that causes the bones to become thin and break more easily (osteoporosis). You may also lose some height, have back pain, and have a stooped posture. Although osteopenia is not a disease, making changes to your lifestyle and diet can help to prevent osteopenia from developing into osteoporosis. What are the causes? Osteopenia is caused by loss of calcium in the bones.  Bones are constantly changing. Old bone cells are continually being replaced with new bone cells. This process builds new bone. The mineral calcium is needed to build new bone and maintain bone density. Bone density is usually highest around age 28. After that, most people's bodies cannot replace all the bone they have lost with new bone. What increases the risk? You are more likely to develop this condition if:  You are older than age 28.  You are a woman who went through menopause early.  You have a long illness that keeps you in bed.  You do not get enough exercise.  You lack certain nutrients (malnutrition).  You have an overactive thyroid gland (hyperthyroidism).  You smoke.  You drink a lot of alcohol.  You are taking medicines that weaken the bones, such as steroids. What are the signs or symptoms? This condition does not cause any symptoms. You may have a slightly higher risk for bone breaks (fractures), so getting fractures more easily than normal may be an indication of osteopenia. How is this diagnosed? Your health care provider can diagnose this condition with a  special type of X-ray exam that measures bone density (dual-energy X-ray absorptiometry, DEXA). This test can measure bone density in your hips, spine, and wrists. Osteopenia has no symptoms, so this condition is usually diagnosed after a routine bone density screening test is done for osteoporosis. This routine screening is usually done for:  Women who are age 65 or older.  Men who are age 97 or older. If you have risk factors for osteopenia, you may have the screening test at an earlier age. How is this treated? Making dietary and lifestyle changes can lower your risk for osteoporosis. If you have severe osteopenia that is close to becoming osteoporosis, your health care provider may prescribe medicines and dietary supplements such as calcium and vitamin D. These supplements help to rebuild bone density. Follow these instructions at home:   Take over-the-counter and prescription medicines only as told by your health care provider. These include vitamins and supplements.  Eat a diet that is high in calcium and vitamin D. ? Calcium is found in dairy products, beans, salmon, and leafy green vegetables like spinach and broccoli. ? Look for foods that have vitamin D and calcium added to them (fortified foods), such as orange juice, cereal, and bread.  Do 30 or more minutes of a weight-bearing exercise every day, such as walking, jogging, or playing a sport. These types of exercises strengthen the bones.  Take precautions at home to lower your risk of falling, such as: ? Keeping rooms well-lit and free of clutter, such as  cords. ? Installing safety rails on stairs. ? Using rubber mats in the bathroom or other areas that are often wet or slippery.  Do not use any products that contain nicotine or tobacco, such as cigarettes and e-cigarettes. If you need help quitting, ask your health care provider.  Avoid alcohol or limit alcohol intake to no more than 1 drink a day for nonpregnant women and 2  drinks a day for men. One drink equals 12 oz of beer, 5 oz of wine, or 1 oz of hard liquor.  Keep all follow-up visits as told by your health care provider. This is important. Contact a health care provider if:  You have not had a bone density screening for osteoporosis and you are: ? A woman, age 38 or older. ? A man, age 78 or older.  You are a postmenopausal woman who has not had a bone density screening for osteoporosis.  You are older than age 8 and you want to know if you should have bone density screening for osteoporosis. Summary  Osteopenia is a loss of thickness (density) inside of the bones. Another name for osteopenia is low bone mass.  Osteopenia is not a disease, but it may increase your risk for a condition that causes the bones to become thin and break more easily (osteoporosis).  You may be at risk for osteopenia if you are older than age 50 or if you are a woman who went through early menopause.  Osteopenia does not cause any symptoms, but it can be diagnosed with a bone density screening test.  Dietary and lifestyle changes are the first treatment for osteopenia. These may lower your risk for osteoporosis. This information is not intended to replace advice given to you by your health care provider. Make sure you discuss any questions you have with your health care provider. Document Revised: 06/07/2017 Document Reviewed: 04/03/2017 Elsevier Patient Education  2020 [place].  Prediabetes Eating Plan Prediabetes is a condition that causes blood sugar (glucose) levels to be higher than normal. This increases the risk for developing diabetes. In order to prevent diabetes from developing, your health care provider may recommend a diet and other lifestyle changes to help you:  Control your blood glucose levels.  Improve your cholesterol levels.  Manage your blood pressure. Your health care provider may recommend working with a diet and nutrition specialist  (dietitian) to make a meal plan that is best for you. What are tips for following this plan? Lifestyle  Set weight loss goals with the help of your health care team. It is recommended that most people with prediabetes lose 7% of their current body weight.  Exercise for at least 30 minutes at least 5 days a week.  Attend a support group or seek ongoing support from a mental health counselor.  Take over-the-counter and prescription medicines only as told by your health care provider. Reading food labels  Read food labels to check the amount of fat, salt (sodium), and sugar in prepackaged foods. Avoid foods that have: ? Saturated fats. ? Trans fats. ? Added sugars.  Avoid foods that have more than 300 milligrams (mg) of sodium per serving. Limit your daily sodium intake to less than 2,300 mg each day. Shopping  Avoid buying pre-made and processed foods. Cooking  Cook with olive oil. Do not use butter, lard, or ghee.  Bake, broil, grill, or boil foods. Avoid frying. Meal planning   Work with your dietitian to develop an eating plan that is  right for you. This may include: ? Tracking how many calories you take in. Use a food diary, notebook, or mobile application to track what you eat at each meal. ? Using the glycemic index (GI) to plan your meals. The index tells you how quickly a food will raise your blood glucose. Choose low-GI foods. These foods take a longer time to raise blood glucose.  Consider following a Mediterranean diet. This diet includes: ? Several servings each day of fresh fruits and vegetables. ? Eating fish at least twice a week. ? Several servings each day of whole grains, beans, nuts, and seeds. ? Using olive oil instead of other fats. ? Moderate alcohol consumption. ? Eating small amounts of red meat and whole-fat dairy.  If you have high blood pressure, you may need to limit your sodium intake or follow a diet such as the DASH eating plan. DASH is an eating  plan that aims to lower high blood pressure. What foods are recommended? The items listed below may not be a complete list. Talk with your dietitian about what dietary choices are best for you. Grains Whole grains, such as whole-wheat or whole-grain breads, crackers, cereals, and pasta. Unsweetened oatmeal. Bulgur. Barley. Quinoa. Brown rice. Corn or whole-wheat flour tortillas or taco shells. Vegetables Lettuce. Spinach. Peas. Beets. Cauliflower. Cabbage. Broccoli. Carrots. Tomatoes. Squash. Eggplant. Herbs. Peppers. Onions. Cucumbers. Brussels sprouts. Fruits Berries. Bananas. Apples. Oranges. Grapes. Papaya. Mango. Pomegranate. Kiwi. Grapefruit. Cherries. Meats and other protein foods Seafood. Poultry without skin. Lean cuts of pork and beef. Tofu. Eggs. Nuts. Beans. Dairy Low-fat or fat-free dairy products, such as yogurt, cottage cheese, and cheese. Beverages Water. Tea. Coffee. Sugar-free or diet soda. Seltzer water. Lowfat or no-fat milk. Milk alternatives, such as soy or almond milk. Fats and oils Olive oil. Canola oil. Sunflower oil. Grapeseed oil. Avocado. Walnuts. Sweets and desserts Sugar-free or low-fat pudding. Sugar-free or low-fat ice cream and other frozen treats. Seasoning and other foods Herbs. Sodium-free spices. Mustard. Relish. Low-fat, low-sugar ketchup. Low-fat, low-sugar barbecue sauce. Low-fat or fat-free mayonnaise. What foods are not recommended? The items listed below may not be a complete list. Talk with your dietitian about what dietary choices are best for you. Grains Refined white flour and flour products, such as bread, pasta, snack foods, and cereals. Vegetables Canned vegetables. Frozen vegetables with butter or cream sauce. Fruits Fruits canned with syrup. Meats and other protein foods Fatty cuts of meat. Poultry with skin. Breaded or fried meat. Processed meats. Dairy Full-fat yogurt, cheese, or milk. Beverages Sweetened drinks, such as sweet  iced tea and soda. Fats and oils Butter. Lard. Ghee. Sweets and desserts Baked goods, such as cake, cupcakes, pastries, cookies, and cheesecake. Seasoning and other foods Spice mixes with added salt. Ketchup. Barbecue sauce. Mayonnaise. Summary  To prevent diabetes from developing, you may need to make diet and other lifestyle changes to help control blood sugar, improve cholesterol levels, and manage your blood pressure.  Set weight loss goals with the help of your health care team. It is recommended that most people with prediabetes lose 7 percent of their current body weight.  Consider following a Mediterranean diet that includes plenty of fresh fruits and vegetables, whole grains, beans, nuts, seeds, fish, lean meat, low-fat dairy, and healthy oils. This information is not intended to replace advice given to you by your health care provider. Make sure you discuss any questions you have with your health care provider. Document Revised: 10/17/2018 Document Reviewed: 08/29/2016 Elsevier Patient Education  [place].   Back Exercises The following exercises strengthen the muscles that help to support the trunk and back. They also help to keep the lower back flexible. Doing these exercises can help to prevent back pain or lessen existing pain.  If you have back pain or discomfort, try doing these exercises 2-3 times each day or as told by your health care provider.  As your pain improves, do them once each day, but increase the number of times that you repeat the steps for each exercise (do more repetitions).  To prevent the recurrence of back pain, continue to do these exercises once each day or as told by your health care provider. Do exercises exactly as told by your health care provider and adjust them as directed. It is normal to feel mild stretching, pulling, tightness, or discomfort as you do these exercises, but you should stop right away if you feel sudden pain or your  pain gets worse. Exercises Single knee to chest Repeat these steps 3-5 times for each leg: 1. Lie on your back on a firm bed or the floor with your legs extended. 2. Bring one knee to your chest. Your other leg should stay extended and in contact with the floor. 3. Hold your knee in place by grabbing your knee or thigh with both hands and hold. 4. Pull on your knee until you feel a gentle stretch in your lower back or buttocks. 5. Hold the stretch for 10-30 seconds. 6. Slowly release and straighten your leg. Pelvic tilt Repeat these steps 5-10 times: 1. Lie on your back on a firm bed or the floor with your legs extended. 2. Bend your knees so they are pointing toward the ceiling and your feet are flat on the floor. 3. Tighten your lower abdominal muscles to press your lower back against the floor. This motion will tilt your pelvis so your tailbone points up toward the ceiling instead of pointing to your feet or the floor. 4. With gentle tension and even breathing, hold this position for 5-10 seconds. Cat-cow Repeat these steps until your lower back becomes more flexible: 1. Get into a hands-and-knees position on a firm surface. Keep your hands under your shoulders, and keep your knees under your hips. You may place padding under your knees for comfort. 2. Let your head hang down toward your chest. Contract your abdominal muscles and point your tailbone toward the floor so your lower back becomes rounded like the back of a cat. 3. Hold this position for 5 seconds. 4. Slowly lift your head, let your abdominal muscles relax and point your tailbone up toward the ceiling so your back forms a sagging arch like the back of a cow. 5. Hold this position for 5 seconds.  Press-ups Repeat these steps 5-10 times: 1. Lie on your abdomen (face-down) on the floor. 2. Place your palms near your head, about shoulder-width apart. 3. Keeping your back as relaxed as possible and keeping your hips on the  floor, slowly straighten your arms to raise the top half of your body and lift your shoulders. Do not use your back muscles to raise your upper torso. You may adjust the placement of your hands to make yourself more comfortable. 4. Hold this position for 5 seconds while you keep your back relaxed. 5. Slowly return to lying flat on the floor.  Bridges Repeat these steps 10 times: 1. Lie on your back on a firm surface. 2. Bend your knees so they  are pointing toward the ceiling and your feet are flat on the floor. Your arms should be flat at your sides, next to your body. 3. Tighten your buttocks muscles and lift your buttocks off the floor until your waist is at almost the same height as your knees. You should feel the muscles working in your buttocks and the back of your thighs. If you do not feel these muscles, slide your feet 1-2 inches farther away from your buttocks. 4. Hold this position for 3-5 seconds. 5. Slowly lower your hips to the starting position, and allow your buttocks muscles to relax completely. If this exercise is too easy, try doing it with your arms crossed over your chest. Abdominal crunches Repeat these steps 5-10 times: 1. Lie on your back on a firm bed or the floor with your legs extended. 2. Bend your knees so they are pointing toward the ceiling and your feet are flat on the floor. 3. Cross your arms over your chest. 4. Tip your chin slightly toward your chest without bending your neck. 5. Tighten your abdominal muscles and slowly raise your trunk (torso) high enough to lift your shoulder blades a tiny bit off the floor. Avoid raising your torso higher than that because it can put too much stress on your low back and does not help to strengthen your abdominal muscles. 6. Slowly return to your starting position. Back lifts Repeat these steps 5-10 times: 1. Lie on your abdomen (face-down) with your arms at your sides, and rest your forehead on the floor. 2. Tighten the  muscles in your legs and your buttocks. 3. Slowly lift your chest off the floor while you keep your hips pressed to the floor. Keep the back of your head in line with the curve in your back. Your eyes should be looking at the floor. 4. Hold this position for 3-5 seconds. 5. Slowly return to your starting position. Contact a health care provider if:  Your back pain or discomfort gets much worse when you do an exercise.  Your worsening back pain or discomfort does not lessen within 2 hours after you exercise. If you have any of these problems, stop doing these exercises right away. Do not do them again unless your health care provider says that you can. Get help right away if:  You develop sudden, severe back pain. If this happens, stop doing the exercises right away. Do not do them again unless your health care provider says that you can. This information is not intended to replace advice given to you by your health care provider. Make sure you discuss any questions you have with your health care provider. Document Revised: 10/30/2018 Document Reviewed: 03/27/2018 Elsevier Patient Education  [place].
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
## 2020-04-21 ENCOUNTER — Other Ambulatory Visit: Payer: Self-pay

## 2020-04-21 ENCOUNTER — Other Ambulatory Visit (INDEPENDENT_AMBULATORY_CARE_PROVIDER_SITE_OTHER): Payer: PPO

## 2020-04-21 DIAGNOSIS — R17 Unspecified jaundice: Secondary | ICD-10-CM | POA: Diagnosis not present

## 2020-04-21 LAB — BILIRUBIN, DIRECT: Bilirubin, Direct: 0.2 mg/dL (ref 0.0–0.3)

## 2020-04-21 LAB — HEPATIC FUNCTION PANEL
ALT: 34 U/L (ref 0–53)
AST: 22 U/L (ref 0–37)
Albumin: 4.2 g/dL (ref 3.5–5.2)
Alkaline Phosphatase: 65 U/L (ref 39–117)
Bilirubin, Direct: 0.2 mg/dL (ref 0.0–0.3)
Total Bilirubin: 1.4 mg/dL — ABNORMAL HIGH (ref 0.2–1.2)
Total Protein: 6.5 g/dL (ref 6.0–8.3)

## 2020-04-25 ENCOUNTER — Telehealth: Payer: Self-pay

## 2020-04-25 NOTE — Telephone Encounter (Signed)
Faxed via epic routing

## 2020-04-25 NOTE — Telephone Encounter (Signed)
Pt would like his last lab results faxed to [phone]

## 2020-07-18 DIAGNOSIS — L570 Actinic keratosis: Secondary | ICD-10-CM | POA: Diagnosis not present

## 2020-07-18 DIAGNOSIS — C44229 Squamous cell carcinoma of skin of left ear and external auricular canal: Secondary | ICD-10-CM | POA: Diagnosis not present

## 2020-10-17 ENCOUNTER — Telehealth: Payer: Self-pay

## 2020-10-17 NOTE — Telephone Encounter (Signed)
Unable to reach patient on preferred number for scheduled AWV. No answer. No voicemail.

## 2020-10-17 NOTE — Progress Notes (Signed)
Unable to each patient for visit. Opened in error. Erroneous encounter.

## 2020-10-18 ENCOUNTER — Ambulatory Visit: Payer: Medicare HMO

## 2020-11-11 ENCOUNTER — Telehealth: Payer: Self-pay | Admitting: Internal Medicine

## 2020-11-11 NOTE — Telephone Encounter (Signed)
Left message for patient to call back and schedule Medicare Annual Wellness Visit (AWV) in office.   If not able to come in office, please offer to do virtually or by telephone.   Last AWV:10/15/2019  Please schedule at anytime with Nurse Health Advisor.   

## 2020-11-14 ENCOUNTER — Ambulatory Visit: Payer: Medicare HMO

## 2020-11-23 ENCOUNTER — Telehealth: Payer: Self-pay

## 2020-11-23 ENCOUNTER — Ambulatory Visit (INDEPENDENT_AMBULATORY_CARE_PROVIDER_SITE_OTHER): Payer: Medicare HMO

## 2020-11-23 VITALS — BP 130/80 | HR 68 | Ht 67.5 in | Wt 168.0 lb

## 2020-11-23 DIAGNOSIS — Z0000 Encounter for general adult medical examination without abnormal findings: Secondary | ICD-10-CM | POA: Diagnosis not present

## 2020-11-23 NOTE — Patient Instructions (Addendum)
Brian Valencia , Thank you for taking time to come for your Medicare Wellness Visit. I appreciate your ongoing commitment to your health goals. Please review the following plan we discussed and let me know if I can assist you in the future.   These are the goals we discussed: Goals      Patient Stated   .  I would like to do more gym exercises and/or use stationary bike (pt-stated)       This is a list of the screening recommended for you and due dates:  Health Maintenance  Topic Date Due  . COVID-19 Vaccine (3 - Pfizer risk 4-dose series) 10/19/2019  . Pneumonia vaccines (2 of 2 - PPSV23) 01/13/2021  . Flu Shot  02/06/2021  . Tetanus Vaccine  06/23/2028  . Hepatitis C Screening: USPSTF Recommendation to screen - Ages 67-79 yo.  Completed  . HPV Vaccine  Aged Out   Advanced directives: End of life planning; Advance aging; Advanced directives discussed.  Copy of current HCPOA/Living Will requested.    Conditions/risks identified: None new  Follow up in one year for your annual wellness visit.   Preventive Care 79 Years and Older, Male Preventive care refers to lifestyle choices and visits with your health care provider that can promote health and wellness. What does preventive care include?  A yearly physical exam. This is also called an annual well check.  Dental exams once or twice a year.  Routine eye exams. Ask your health care provider how often you should have your eyes checked.  Personal lifestyle choices, including:  Daily care of your teeth and gums.  Regular physical activity.  Eating a healthy diet.  Avoiding tobacco and drug use.  Limiting alcohol use.  Practicing safe sex.  Taking low doses of aspirin every day.  Taking vitamin and mineral supplements as recommended by your health care provider. What happens during an annual well check? The services and screenings done by your health care provider during your annual well check will depend on your  age, overall health, lifestyle risk factors, and family history of disease. Counseling  Your health care provider may ask you questions about your:  Alcohol use.  Tobacco use.  Drug use.  Emotional well-being.  Home and relationship well-being.  Sexual activity.  Eating habits.  History of falls.  Memory and ability to understand (cognition).  Work and work Statistician. Screening  You may have the following tests or measurements:  Height, weight, and BMI.  Blood pressure.  Lipid and cholesterol levels. These may be checked every 5 years, or more frequently if you are over 58 years old.  Skin check.  Lung cancer screening. You may have this screening every year starting at age 65 if you have a 30-pack-year history of smoking and currently smoke or have quit within the past 15 years.  Fecal occult blood test (FOBT) of the stool. You may have this test every year starting at age 30.  Flexible sigmoidoscopy or colonoscopy. You may have a sigmoidoscopy every 5 years or a colonoscopy every 10 years starting at age 44.  Prostate cancer screening. Recommendations will vary depending on your family history and other risks.  Hepatitis C blood test.  Hepatitis B blood test.  Sexually transmitted disease (STD) testing.  Diabetes screening. This is done by checking your blood sugar (glucose) after you have not eaten for a while (fasting). You may have this done every 1-3 years.  Abdominal aortic aneurysm (AAA) screening. You may  need this if you are a current or former smoker.  Osteoporosis. You may be screened starting at age 47 if you are at high risk. Talk with your health care provider about your test results, treatment options, and if necessary, the need for more tests. Vaccines  Your health care provider may recommend certain vaccines, such as:  Influenza vaccine. This is recommended every year.  Tetanus, diphtheria, and acellular pertussis (Tdap, Td) vaccine. You  may need a Td booster every 10 years.  Zoster vaccine. You may need this after age 40.  Pneumococcal 13-valent conjugate (PCV13) vaccine. One dose is recommended after age 60.  Pneumococcal polysaccharide (PPSV23) vaccine. One dose is recommended after age 32. Talk to your health care provider about which screenings and vaccines you need and how often you need them. This information is not intended to replace advice given to you by your health care provider. Make sure you discuss any questions you have with your health care provider. Document Released: 07/22/2015 Document Revised: 03/14/2016 Document Reviewed: 04/26/2015 Elsevier Interactive Patient Education  2017 [place] Prevention in the Home Falls can cause injuries. They can happen to people of all ages. There are many things you can do to make your home safe and to help prevent falls. What can I do on the outside of my home?  Regularly fix the edges of walkways and driveways and fix any cracks.  Remove anything that might make you trip as you walk through a door, such as a raised step or threshold.  Trim any bushes or trees on the path to your home.  Use bright outdoor lighting.  Clear any walking paths of anything that might make someone trip, such as rocks or tools.  Regularly check to see if handrails are loose or broken. Make sure that both sides of any steps have handrails.  Any raised decks and porches should have guardrails on the edges.  Have any leaves, snow, or ice cleared regularly.  Use sand or salt on walking paths during winter.  Clean up any spills in your garage right away. This includes oil or grease spills. What can I do in the bathroom?  Use night lights.  Install grab bars by the toilet and in the tub and shower. Do not use towel bars as grab bars.  Use non-skid mats or decals in the tub or shower.  If you need to sit down in the shower, use a plastic, non-slip stool.  Keep the floor  dry. Clean up any water that spills on the floor as soon as it happens.  Remove soap buildup in the tub or shower regularly.  Attach bath mats securely with double-sided non-slip rug tape.  Do not have throw rugs and other things on the floor that can make you trip. What can I do in the bedroom?  Use night lights.  Make sure that you have a light by your bed that is easy to reach.  Do not use any sheets or blankets that are too big for your bed. They should not hang down onto the floor.  Have a firm chair that has side arms. You can use this for support while you get dressed.  Do not have throw rugs and other things on the floor that can make you trip. What can I do in the kitchen?  Clean up any spills right away.  Avoid walking on wet floors.  Keep items that you use a lot in easy-to-reach places.  If  you need to reach something above you, use a strong step stool that has a grab bar.  Keep electrical cords out of the way.  Do not use floor polish or wax that makes floors slippery. If you must use wax, use non-skid floor wax.  Do not have throw rugs and other things on the floor that can make you trip. What can I do with my stairs?  Do not leave any items on the stairs.  Make sure that there are handrails on both sides of the stairs and use them. Fix handrails that are broken or loose. Make sure that handrails are as long as the stairways.  Check any carpeting to make sure that it is firmly attached to the stairs. Fix any carpet that is loose or worn.  Avoid having throw rugs at the top or bottom of the stairs. If you do have throw rugs, attach them to the floor with carpet tape.  Make sure that you have a light switch at the top of the stairs and the bottom of the stairs. If you do not have them, ask someone to add them for you. What else can I do to help prevent falls?  Wear shoes that:  Do not have high heels.  Have rubber bottoms.  Are comfortable and fit you  well.  Are closed at the toe. Do not wear sandals.  If you use a stepladder:  Make sure that it is fully opened. Do not climb a closed stepladder.  Make sure that both sides of the stepladder are locked into place.  Ask someone to hold it for you, if possible.  Clearly mark and make sure that you can see:  Any grab bars or handrails.  First and last steps.  Where the edge of each step is.  Use tools that help you move around (mobility aids) if they are needed. These include:  Canes.  Walkers.  Scooters.  Crutches.  Turn on the lights when you go into a dark area. Replace any light bulbs as soon as they burn out.  Set up your furniture so you have a clear path. Avoid moving your furniture around.  If any of your floors are uneven, fix them.  If there are any pets around you, be aware of where they are.  Review your medicines with your doctor. Some medicines can make you feel dizzy. This can increase your chance of falling. Ask your doctor what other things that you can do to help prevent falls. This information is not intended to replace advice given to you by your health care provider. Make sure you discuss any questions you have with your health care provider. Document Released: 04/21/2009 Document Revised: 12/01/2015 Document Reviewed: 07/30/2014 Elsevier Interactive Patient Education  2017 [employer].

## 2020-11-23 NOTE — Telephone Encounter (Signed)
Called patient at scheduled appointment time x2. Wife remarks on patient not having his phone but she would relay the message to call the office back and complete the appointment. Okay to reschedule appointment.
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
## 2021-01-13 ENCOUNTER — Encounter: Payer: Self-pay | Admitting: Internal Medicine

## 2021-01-13 ENCOUNTER — Other Ambulatory Visit: Payer: Self-pay

## 2021-01-13 ENCOUNTER — Ambulatory Visit (INDEPENDENT_AMBULATORY_CARE_PROVIDER_SITE_OTHER): Payer: Medicare HMO | Admitting: Internal Medicine

## 2021-01-13 VITALS — BP 130/74 | HR 80 | Temp 97.4°F | Ht 66.73 in | Wt 168.4 lb

## 2021-01-13 DIAGNOSIS — Z13220 Encounter for screening for lipoid disorders: Secondary | ICD-10-CM

## 2021-01-13 DIAGNOSIS — Z0000 Encounter for general adult medical examination without abnormal findings: Secondary | ICD-10-CM | POA: Diagnosis not present

## 2021-01-13 DIAGNOSIS — Z125 Encounter for screening for malignant neoplasm of prostate: Secondary | ICD-10-CM | POA: Diagnosis not present

## 2021-01-13 DIAGNOSIS — R7989 Other specified abnormal findings of blood chemistry: Secondary | ICD-10-CM

## 2021-01-13 DIAGNOSIS — E291 Testicular hypofunction: Secondary | ICD-10-CM

## 2021-01-13 DIAGNOSIS — H269 Unspecified cataract: Secondary | ICD-10-CM | POA: Insufficient documentation

## 2021-01-13 DIAGNOSIS — H6123 Impacted cerumen, bilateral: Secondary | ICD-10-CM

## 2021-01-13 DIAGNOSIS — Z23 Encounter for immunization: Secondary | ICD-10-CM | POA: Diagnosis not present

## 2021-01-13 DIAGNOSIS — R1084 Generalized abdominal pain: Secondary | ICD-10-CM

## 2021-01-13 DIAGNOSIS — Z1329 Encounter for screening for other suspected endocrine disorder: Secondary | ICD-10-CM

## 2021-01-13 DIAGNOSIS — R972 Elevated prostate specific antigen [PSA]: Secondary | ICD-10-CM

## 2021-01-13 DIAGNOSIS — R7303 Prediabetes: Secondary | ICD-10-CM | POA: Diagnosis not present

## 2021-01-13 DIAGNOSIS — Z1389 Encounter for screening for other disorder: Secondary | ICD-10-CM

## 2021-01-13 DIAGNOSIS — R911 Solitary pulmonary nodule: Secondary | ICD-10-CM

## 2021-01-13 DIAGNOSIS — R918 Other nonspecific abnormal finding of lung field: Secondary | ICD-10-CM
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
## 2021-01-13 NOTE — Addendum Note (Signed)
Addended by: Fulton Mole D on: 01/13/2021 10:03 AM   Modules accepted: Orders

## 2021-01-13 NOTE — Patient Instructions (Addendum)
Zoster Vaccine, Recombinant injection What is this medication? ZOSTER VACCINE (ZOS ter vak SEEN) is a vaccine used to reduce the risk of getting shingles. This vaccine is not used to treat shingles or nerve pain fromshingles. This medicine may be used for other purposes; ask your health care provider orpharmacist if you have questions. COMMON BRAND NAME(S): [facility] What should I tell my care team before I take this medication? They need to know if you have any of these conditions: cancer immune system problems an unusual or allergic reaction to Zoster vaccine, other medications, foods, dyes, or preservatives pregnant or trying to get pregnant breast-feeding How should I use this medication? This vaccine is injected into a muscle. It is given by a health care provider. A copy of Vaccine Information Statements will be given before each vaccination. Be sure to read this information carefully each time. This sheet may changeoften. Talk to your health care provider about the use of this vaccine in children.This vaccine is not approved for use in children. Overdosage: If you think you have taken too much of this medicine contact apoison control center or emergency room at once. NOTE: This medicine is only for you. Do not share this medicine with others. What if I miss a dose? Keep appointments for follow-up (booster) doses. It is important not to miss your dose. Call your health care provider if you are unable to keep anappointment. What may interact with this medication? medicines that suppress your immune system medicines to treat cancer steroid medicines like prednisone or cortisone This list may not describe all possible interactions. Give your health care provider a list of all the medicines, herbs, non-prescription drugs, or dietary supplements you use. Also tell them if you smoke, drink alcohol, or use illegaldrugs. Some items may interact with your medicine. What should I watch for while  using this medication? Visit your health care provider regularly. This vaccine, like all vaccines, may not fully protect everyone. What side effects may I notice from receiving this medication? Side effects that you should report to your doctor or health care professionalas soon as possible: allergic reactions (skin rash, itching or hives; swelling of the face, lips, or tongue) trouble breathing Side effects that usually do not require medical attention (report these toyour doctor or health care professional if they continue or are bothersome): chills headache fever nausea pain, redness, or irritation at site where injected tiredness vomiting This list may not describe all possible side effects. Call your doctor for medical advice about side effects. You may report side effects to FDA at1-800-FDA-1088. Where should I keep my medication? This vaccine is only given by a health care provider. It will not be stored athome. NOTE: This sheet is a summary. It may not cover all possible information. If you have questions about this medicine, talk to your doctor, pharmacist, orhealth care provider.  2022 Elsevier/Gold Standard (2019-07-31 16:23:07)

## 2021-01-13 NOTE — Progress Notes (Signed)
Patient was in today for a office visit and he forgot his RX for his shingles vaccine and this prescription was mailed to his home today.  Brian Valencia,cma

## 2021-01-19 ENCOUNTER — Other Ambulatory Visit: Payer: Self-pay

## 2021-01-19 ENCOUNTER — Other Ambulatory Visit (INDEPENDENT_AMBULATORY_CARE_PROVIDER_SITE_OTHER): Payer: Medicare HMO

## 2021-01-19 DIAGNOSIS — Z1329 Encounter for screening for other suspected endocrine disorder: Secondary | ICD-10-CM

## 2021-01-19 DIAGNOSIS — Z0000 Encounter for general adult medical examination without abnormal findings: Secondary | ICD-10-CM | POA: Diagnosis not present

## 2021-01-19 DIAGNOSIS — Z13220 Encounter for screening for lipoid disorders: Secondary | ICD-10-CM | POA: Diagnosis not present

## 2021-01-19 DIAGNOSIS — R7303 Prediabetes: Secondary | ICD-10-CM

## 2021-01-19 DIAGNOSIS — Z1389 Encounter for screening for other disorder: Secondary | ICD-10-CM

## 2021-01-19 DIAGNOSIS — E291 Testicular hypofunction: Secondary | ICD-10-CM | POA: Diagnosis not present

## 2021-01-19 DIAGNOSIS — Z125 Encounter for screening for malignant neoplasm of prostate: Secondary | ICD-10-CM

## 2021-01-19 LAB — URINALYSIS, ROUTINE W REFLEX MICROSCOPIC
Bilirubin Urine: NEGATIVE
Hgb urine dipstick: NEGATIVE
Ketones, ur: NEGATIVE
Leukocytes,Ua: NEGATIVE
Nitrite: NEGATIVE
RBC / HPF: NONE SEEN (ref 0–?)
Specific Gravity, Urine: 1.02 (ref 1.000–1.030)
Total Protein, Urine: NEGATIVE
Urine Glucose: NEGATIVE
Urobilinogen, UA: 1 (ref 0.0–1.0)
pH: 6 (ref 5.0–8.0)

## 2021-01-19 LAB — CBC WITH DIFFERENTIAL/PLATELET
Basophils Absolute: 0 10*3/uL (ref 0.0–0.1)
Basophils Relative: 0.4 % (ref 0.0–3.0)
Eosinophils Absolute: 0.7 10*3/uL (ref 0.0–0.7)
Eosinophils Relative: 8.6 % — ABNORMAL HIGH (ref 0.0–5.0)
HCT: 50.5 % (ref 39.0–52.0)
Hemoglobin: 17 g/dL (ref 13.0–17.0)
Lymphocytes Relative: 42.2 % (ref 12.0–46.0)
Lymphs Abs: 3.2 10*3/uL (ref 0.7–4.0)
MCHC: 33.7 g/dL (ref 30.0–36.0)
MCV: 94.1 fl (ref 78.0–100.0)
Monocytes Absolute: 0.5 10*3/uL (ref 0.1–1.0)
Monocytes Relative: 6.4 % (ref 3.0–12.0)
Neutro Abs: 3.2 10*3/uL (ref 1.4–7.7)
Neutrophils Relative %: 42.4 % — ABNORMAL LOW (ref 43.0–77.0)
Platelets: 189 10*3/uL (ref 150.0–400.0)
RBC: 5.36 Mil/uL (ref 4.22–5.81)
RDW: 13.3 % (ref 11.5–15.5)
WBC: 7.6 10*3/uL (ref 4.0–10.5)

## 2021-01-19 LAB — COMPREHENSIVE METABOLIC PANEL
ALT: 32 U/L (ref 0–53)
AST: 20 U/L (ref 0–37)
Albumin: 4.2 g/dL (ref 3.5–5.2)
Alkaline Phosphatase: 68 U/L (ref 39–117)
BUN: 13 mg/dL (ref 6–23)
CO2: 28 mEq/L (ref 19–32)
Calcium: 9.4 mg/dL (ref 8.4–10.5)
Chloride: 102 mEq/L (ref 96–112)
Creatinine, Ser: 1.22 mg/dL (ref 0.40–1.50)
GFR: 56.98 mL/min — ABNORMAL LOW (ref >60.00)
Glucose, Bld: 117 mg/dL — ABNORMAL HIGH (ref 70–99)
Potassium: 4.5 mEq/L (ref 3.5–5.1)
Sodium: 139 mEq/L (ref 135–145)
Total Bilirubin: 1.5 mg/dL — ABNORMAL HIGH (ref 0.2–1.2)
Total Protein: 6.2 g/dL (ref 6.0–8.3)

## 2021-01-19 LAB — TESTOSTERONE: Testosterone: 346.48 ng/dL (ref 300.00–890.00)

## 2021-01-19 LAB — HEMOGLOBIN A1C: Hgb A1c MFr Bld: 6 % (ref 4.6–6.5)

## 2021-01-19 LAB — PSA: PSA: 3.07 ng/mL (ref 0.10–4.00)

## 2021-01-19 LAB — LIPID PANEL
Cholesterol: 158 mg/dL (ref 0–200)
HDL: 39.2 mg/dL (ref >39.00)
LDL Cholesterol: 82 mg/dL (ref 0–99)
NonHDL: 119.03
Total CHOL/HDL Ratio: 4
Triglycerides: 185 mg/dL — ABNORMAL HIGH (ref 0.0–149.0)
VLDL: 37 mg/dL (ref 0.0–40.0)

## 2021-01-19 LAB — TSH: TSH: 1.42 u[IU]/mL (ref 0.35–5.50)

## 2021-01-23 ENCOUNTER — Telehealth: Payer: Self-pay | Admitting: Internal Medicine

## 2021-01-23 NOTE — Addendum Note (Signed)
Addended by: Orland Mustard on: 01/23/2021 07:35 PM   Modules accepted: Orders

## 2021-01-23 NOTE — Telephone Encounter (Signed)
Patient calling in to request 01/19/21 labs be faced to urology Dr Yves Dill. Labs have been faxed via epic routing

## 2021-02-16 ENCOUNTER — Ambulatory Visit
Admission: RE | Admit: 2021-02-16 | Discharge: 2021-02-16 | Disposition: A | Discharge status: Home / Self Care | Payer: Medicare HMO | Source: Ambulatory Visit | Attending: Internal Medicine | Admitting: Internal Medicine

## 2021-02-16 ENCOUNTER — Other Ambulatory Visit: Payer: Self-pay

## 2021-02-16 DIAGNOSIS — R7989 Other specified abnormal findings of blood chemistry: Secondary | ICD-10-CM | POA: Diagnosis not present

## 2021-02-16 DIAGNOSIS — R1084 Generalized abdominal pain: Secondary | ICD-10-CM | POA: Diagnosis not present

## 2021-02-16 DIAGNOSIS — K5730 Diverticulosis of large intestine without perforation or abscess without bleeding: Secondary | ICD-10-CM | POA: Diagnosis not present

## 2021-02-16 DIAGNOSIS — K4090 Unilateral inguinal hernia, without obstruction or gangrene, not specified as recurrent: Secondary | ICD-10-CM | POA: Diagnosis not present

## 2021-02-16 DIAGNOSIS — K449 Diaphragmatic hernia without obstruction or gangrene: Secondary | ICD-10-CM | POA: Diagnosis not present

## 2021-02-17 ENCOUNTER — Encounter: Payer: Self-pay | Admitting: Internal Medicine

## 2021-02-17 ENCOUNTER — Telehealth: Payer: Self-pay | Admitting: Internal Medicine

## 2021-02-17 DIAGNOSIS — K760 Fatty (change of) liver, not elsewhere classified: Secondary | ICD-10-CM | POA: Insufficient documentation

## 2021-02-17 DIAGNOSIS — R918 Other nonspecific abnormal finding of lung field: Secondary | ICD-10-CM | POA: Insufficient documentation

## 2021-02-17 DIAGNOSIS — M47816 Spondylosis without myelopathy or radiculopathy, lumbar region: Secondary | ICD-10-CM

## 2021-02-17 DIAGNOSIS — K449 Diaphragmatic hernia without obstruction or gangrene: Secondary | ICD-10-CM | POA: Insufficient documentation

## 2021-02-17 DIAGNOSIS — N400 Enlarged prostate without lower urinary tract symptoms: Secondary | ICD-10-CM | POA: Insufficient documentation

## 2021-02-17 DIAGNOSIS — R911 Solitary pulmonary nodule: Secondary | ICD-10-CM | POA: Insufficient documentation

## 2021-02-17 HISTORY — DX: Spondylosis without myelopathy or radiculopathy, lumbar region: M47.816

## 2021-02-17 NOTE — Telephone Encounter (Signed)
Patient is calling to see if his ct results can be faxed to [facility] office.Their fax number is [phone].

## 2021-02-17 NOTE — Telephone Encounter (Signed)
I have faxed to Dr. Samuel Germany office.

## 2021-02-17 NOTE — Addendum Note (Signed)
Addended by: Orland Mustard on: 02/17/2021 08:52 AM   Modules accepted: Orders

## 2021-04-07 ENCOUNTER — Telehealth: Payer: Self-pay | Admitting: Internal Medicine

## 2021-04-07 NOTE — Telephone Encounter (Signed)
Rejection Reason - Patient was No Show" Brian Valencia said on Apr 06, 2021 8:30 AM  Appt was on 01/25/2021 at 01:45 pm  Msg from [place] ent

## 2021-04-18 DIAGNOSIS — Z0100 Encounter for examination of eyes and vision without abnormal findings: Secondary | ICD-10-CM | POA: Diagnosis not present

## 2021-04-18 DIAGNOSIS — H35371 Puckering of macula, right eye: Secondary | ICD-10-CM | POA: Diagnosis not present

## 2021-04-20 DIAGNOSIS — Z0100 Encounter for examination of eyes and vision without abnormal findings: Secondary | ICD-10-CM | POA: Diagnosis not present

## 2021-04-21 ENCOUNTER — Telehealth: Payer: Self-pay | Admitting: Internal Medicine

## 2021-04-21 NOTE — Telephone Encounter (Signed)
Patient is calling in to see if Dr.Tracy can prescribe him a prescription for 800 mg Ibuprofen for his back pain due to his wife having the same medicine working for her for this symptom.Offered to schedule patient for an appointment to be evaluated,patient declined and stated he has been seen for this in the past before.Please advise.

## 2021-04-26 NOTE — Telephone Encounter (Signed)
I would rec. Tylenol and prescription tramadol is he agreeable?  I would rec MRI lumbar spine is he agreeable  I would rec PT is he agreeable  And consider ortho spine is he agreeable?

## 2021-04-28 NOTE — Telephone Encounter (Signed)
Left message to call back  

## 2021-05-01 NOTE — Telephone Encounter (Signed)
Left a message to call back. 3rd attempt to contact patient. Letter has been sent.

## 2021-05-01 NOTE — Telephone Encounter (Signed)
Can we reach out again?

## 2021-05-25 ENCOUNTER — Telehealth: Payer: Self-pay | Admitting: Internal Medicine

## 2021-05-25 NOTE — Telephone Encounter (Signed)
I placed something on your desk

## 2021-05-25 NOTE — Telephone Encounter (Signed)
Lft vm for pt to call ofc to sch CT chest. thanks

## 2021-05-25 NOTE — Telephone Encounter (Signed)
Ct chest was not scheduled can you schedule this?

## 2021-05-29 ENCOUNTER — Telehealth: Payer: Self-pay | Admitting: Internal Medicine

## 2021-05-29 NOTE — Telephone Encounter (Signed)
Lft pt vm to call ofc to sch CT chest. thanks 

## 2021-06-05 ENCOUNTER — Telehealth: Payer: Self-pay | Admitting: Internal Medicine

## 2021-06-05 NOTE — Telephone Encounter (Signed)
Lft pt vm on both numbers to call ofc to sch CT. thanks 

## 2021-06-16 ENCOUNTER — Telehealth: Payer: Self-pay | Admitting: Internal Medicine

## 2021-06-16 NOTE — Telephone Encounter (Signed)
Mail a letter please re regarding phone calls thank you

## 2021-06-16 NOTE — Telephone Encounter (Signed)
YoungBlood, Rasheedah R 05/25/2021  8:02 AM Lft vm for pt to call ofc  YoungBlood, Rasheedah R 05/29/2021 12:08 PM Lft pt vm to call ofc  YoungBlood, Rasheedah R 06/05/2021 10:07 AM Lft pt vm on both numbers to call ofc   Pt was called and left messages to call ofc to sch, no mychart available.  Please advise and thank you!

## 2021-06-21 NOTE — Telephone Encounter (Signed)
Letter mailed for Patient to call in to the office

## 2021-07-05 ENCOUNTER — Encounter (HOSPITAL_COMMUNITY): Payer: Self-pay | Admitting: Radiology

## 2021-10-22 IMAGING — DX DG LUMBAR SPINE COMPLETE 4+V
5 series · 5 of 5 positions shown · non-contrast
Comparison: None.

CLINICAL DATA: Low back pain.

EXAM:
LUMBAR SPINE - COMPLETE 4+ VIEW

[lumbar spine ap]
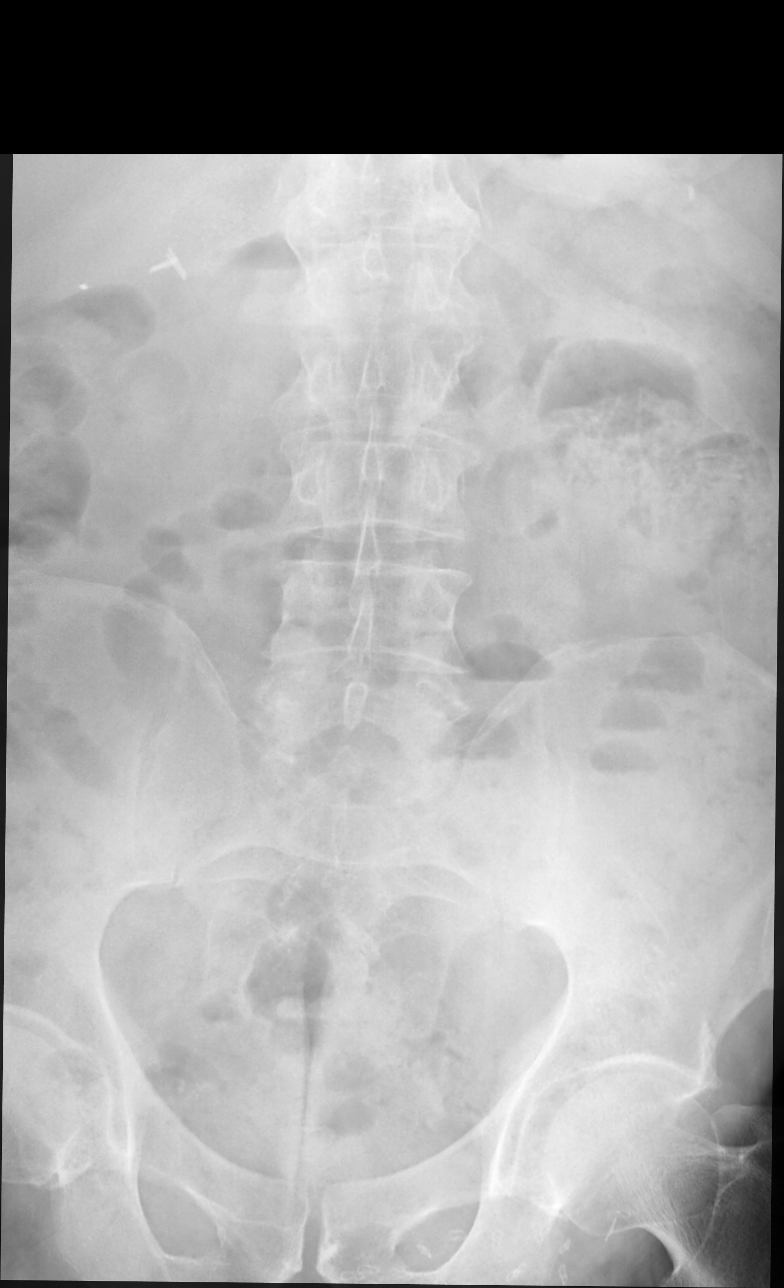

[lumbar spine obl (oblique) (1 of 2)]
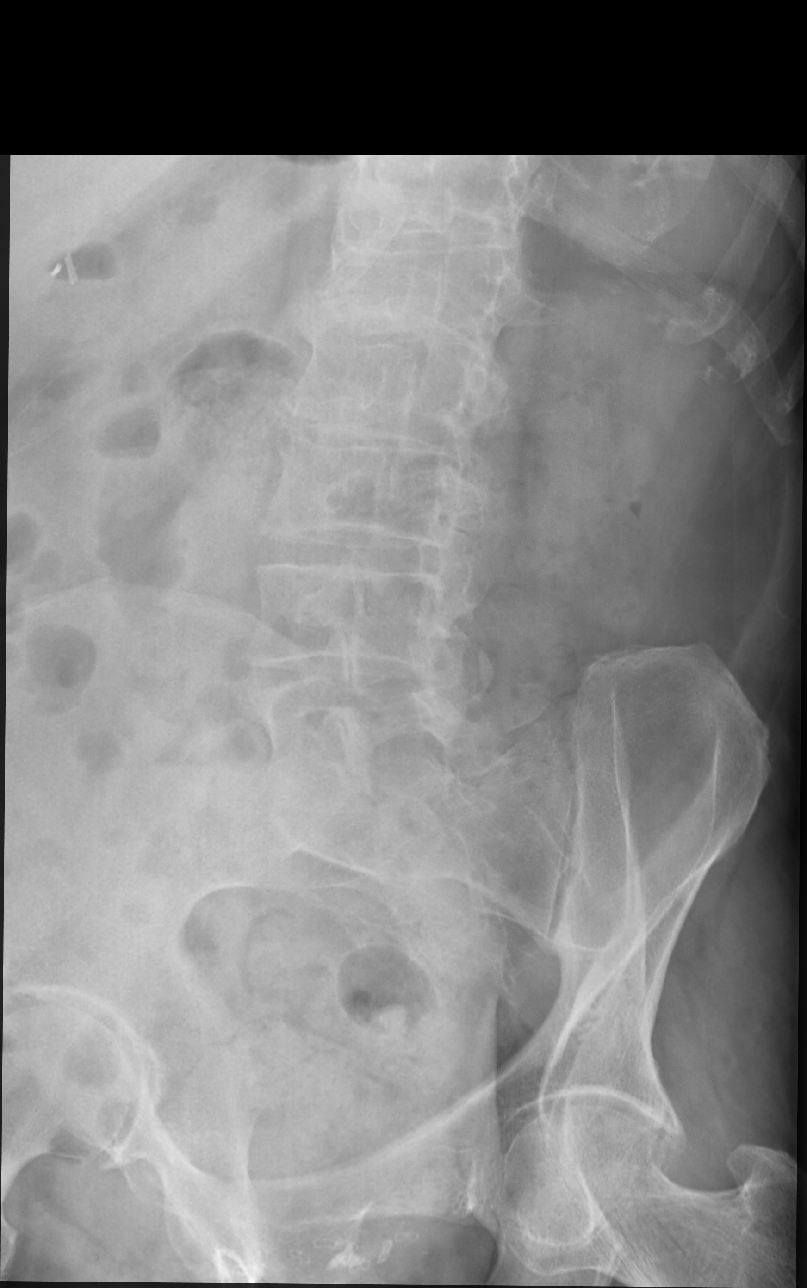

[lumbar spine obl (oblique) (2 of 2)]
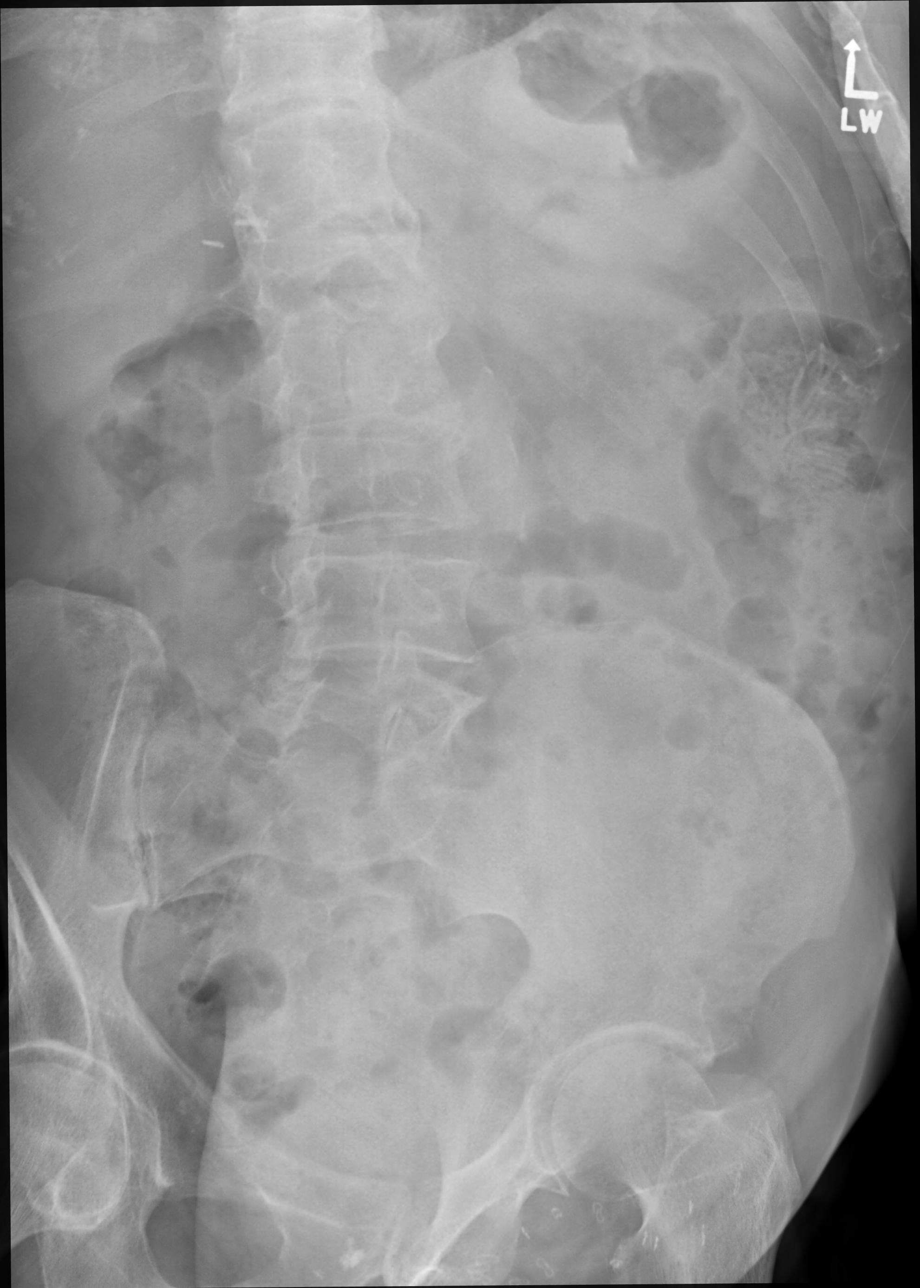

[lumbar spine lat]
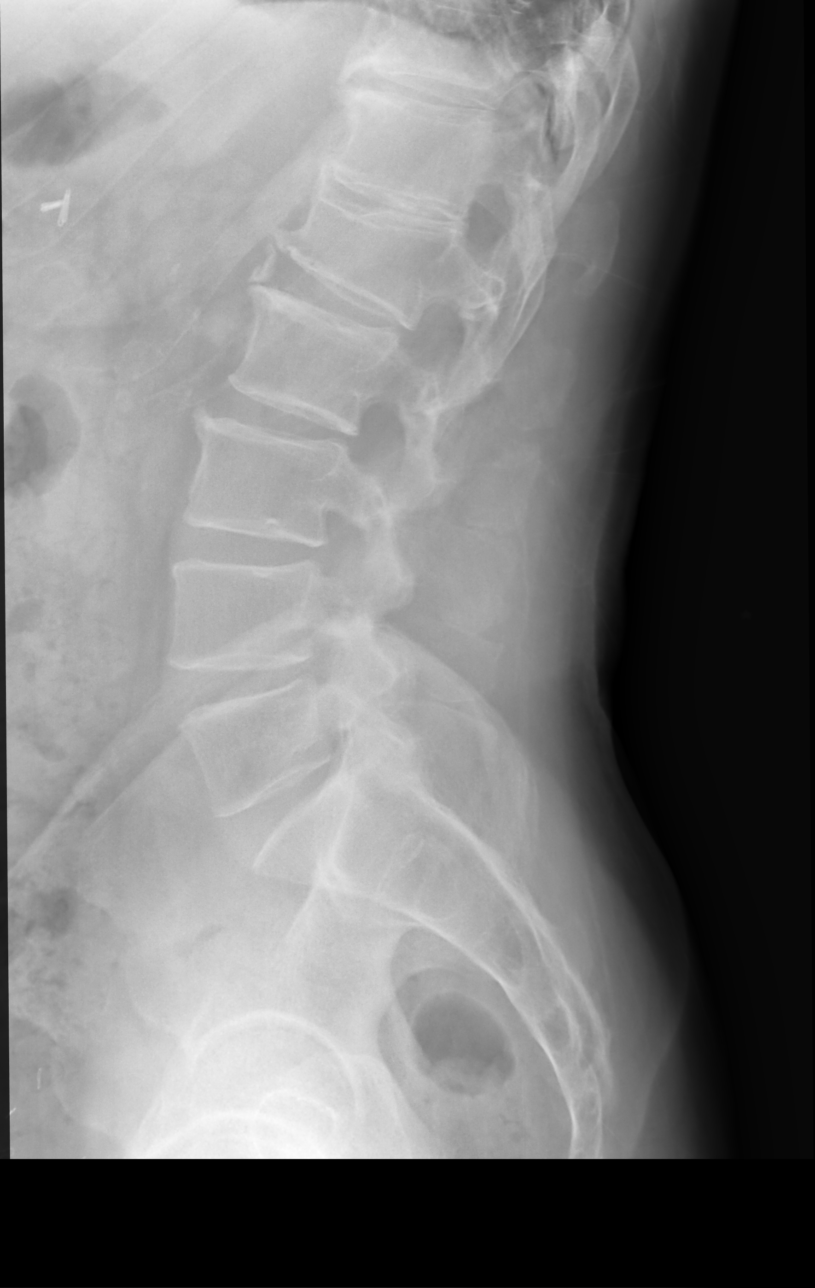

[lumbar spot lat]
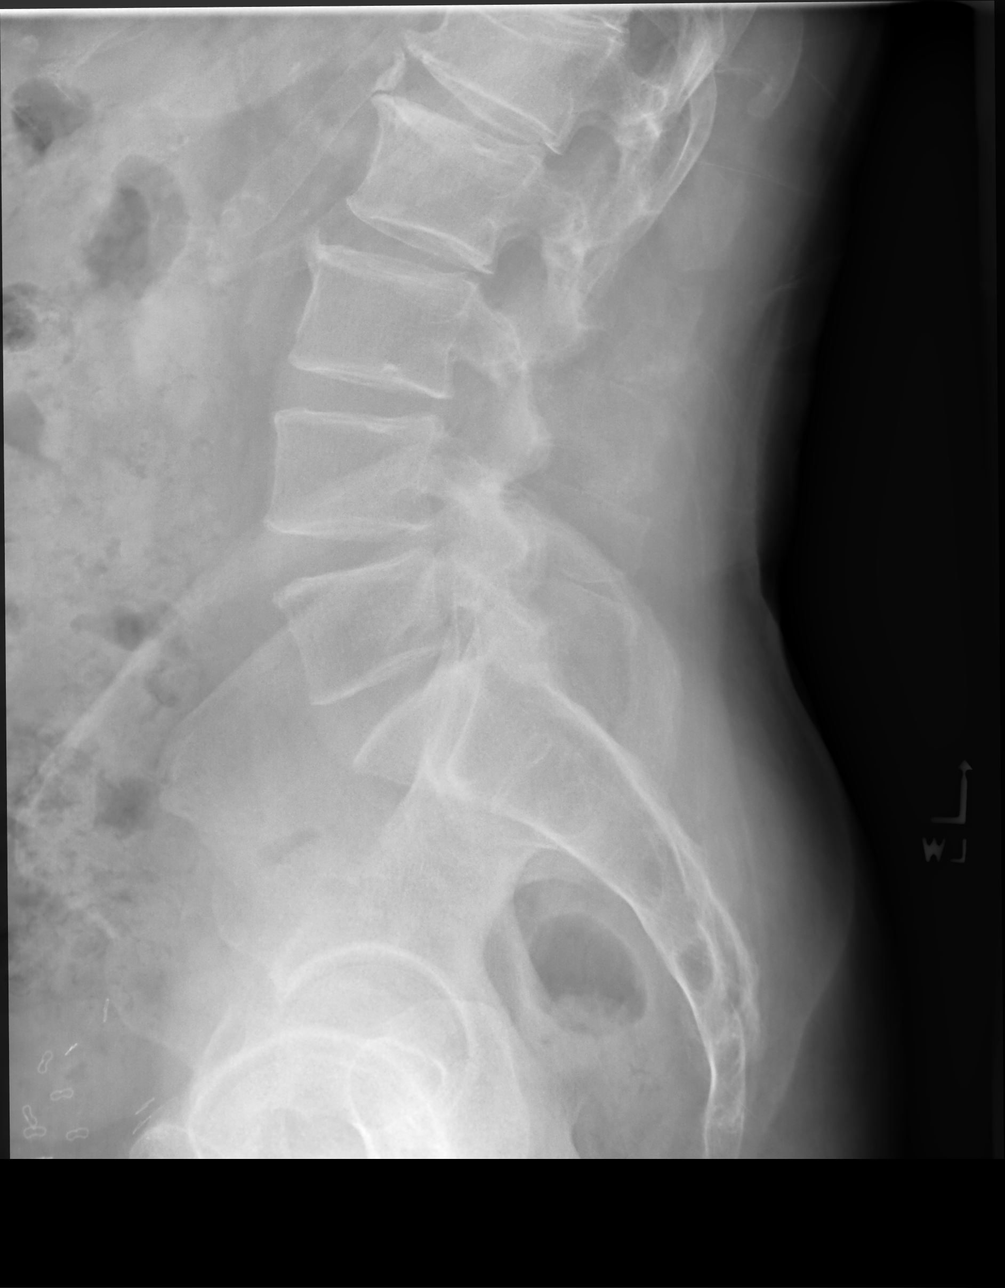

[5 of 5 positions shown; findings below may reference images not displayed]

FINDINGS: There are 5 non rib-bearing lumbar type vertebral bodies.

Normal alignment of the lumbar spine. No anterolisthesis or
retrolisthesis. No definite pars defects.

Age-indeterminate moderate (approximately 35%) compression deformity
involving the superior endplate of the L1 vertebral body. Remaining
lumbar vertebral body heights appear preserved

Mild to moderate multilevel lumbar spine DDD, worse at L1-L2 and
L2-L3 with disc space height loss, endplate irregularity and
sclerosis.

Limited visualization of the bilateral SI joints is normal.
Suspected mild degenerative change the bilateral hips with joint
space loss, subchondral sclerosis and osteophytosis.

Atherosclerotic plaque within the abdominal aorta. Post
cholecystectomy. Moderate to large colonic stool burden without
evidence of enteric obstruction.
IMPRESSION: 1. Age-indeterminate moderate (approximately 35%) compression
deformity involving the superior endplate of the L1 vertebral body.
Correlation for point tenderness at this location is advised.
2. Mild-to-moderate multilevel lumbar spine DDD, worse at L1-L2 and
L2-L3.
3.  Aortic Atherosclerosis ([id number]).

## 2021-11-28 ENCOUNTER — Telehealth: Payer: Self-pay | Admitting: Internal Medicine

## 2021-11-28 NOTE — Telephone Encounter (Signed)
Copied from [place] [id number]. Topic: Medicare AWV >> Nov 28, 2021  1:26 PM Harris-Coley, Hannah Beat wrote: Reason for CRM: Left message for patient to schedule Annual Wellness Visit.  Please schedule with Nurse Health Advisor Denisa O'Brien-Blaney, LPN at [facility].  Please call [phone] ask for [facility]

## 2022-01-10 ENCOUNTER — Ambulatory Visit: Payer: Medicare HMO

## 2022-01-10 ENCOUNTER — Telehealth: Payer: Self-pay

## 2022-01-10 NOTE — Telephone Encounter (Signed)
Unable to reach patient for scheduled AWV. No answer. Left message to reschedule.  

## 2022-01-16 ENCOUNTER — Ambulatory Visit (INDEPENDENT_AMBULATORY_CARE_PROVIDER_SITE_OTHER): Payer: Medicare HMO | Admitting: Internal Medicine

## 2022-01-16 ENCOUNTER — Encounter: Payer: Self-pay | Admitting: Internal Medicine

## 2022-01-16 VITALS — BP 130/80 | HR 76 | Temp 98.0°F | Resp 12 | Ht 66.73 in | Wt 167.2 lb

## 2022-01-16 DIAGNOSIS — Z23 Encounter for immunization: Secondary | ICD-10-CM

## 2022-01-16 DIAGNOSIS — Z13220 Encounter for screening for lipoid disorders: Secondary | ICD-10-CM

## 2022-01-16 DIAGNOSIS — R7989 Other specified abnormal findings of blood chemistry: Secondary | ICD-10-CM

## 2022-01-16 DIAGNOSIS — E559 Vitamin D deficiency, unspecified: Secondary | ICD-10-CM | POA: Diagnosis not present

## 2022-01-16 DIAGNOSIS — R5383 Other fatigue: Secondary | ICD-10-CM

## 2022-01-16 DIAGNOSIS — L570 Actinic keratosis: Secondary | ICD-10-CM

## 2022-01-16 DIAGNOSIS — N400 Enlarged prostate without lower urinary tract symptoms: Secondary | ICD-10-CM | POA: Diagnosis not present

## 2022-01-16 DIAGNOSIS — R7303 Prediabetes: Secondary | ICD-10-CM | POA: Diagnosis not present

## 2022-01-16 DIAGNOSIS — E538 Deficiency of other specified B group vitamins: Secondary | ICD-10-CM | POA: Diagnosis not present

## 2022-01-16 DIAGNOSIS — Z0000 Encounter for general adult medical examination without abnormal findings: Secondary | ICD-10-CM

## 2022-01-16 DIAGNOSIS — E611 Iron deficiency: Secondary | ICD-10-CM

## 2022-01-16 MED ORDER — SHINGRIX 50 MCG/0.5ML IM SUSR: 0.5000 mL | Freq: Once | INTRAMUSCULAR | 1 refills | Status: AC
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
## 2022-01-17 LAB — URINALYSIS, ROUTINE W REFLEX MICROSCOPIC
Bilirubin Urine: NEGATIVE
Glucose, UA: NEGATIVE
Hgb urine dipstick: NEGATIVE
Ketones, ur: NEGATIVE
Leukocytes,Ua: NEGATIVE
Nitrite: NEGATIVE
Protein, ur: NEGATIVE
Specific Gravity, Urine: 1.017 (ref 1.001–1.035)
pH: <=5 (ref 5.0–8.0)

## 2022-01-18 ENCOUNTER — Other Ambulatory Visit (INDEPENDENT_AMBULATORY_CARE_PROVIDER_SITE_OTHER): Payer: Medicare HMO

## 2022-01-18 DIAGNOSIS — Z13220 Encounter for screening for lipoid disorders: Secondary | ICD-10-CM

## 2022-01-18 DIAGNOSIS — E611 Iron deficiency: Secondary | ICD-10-CM | POA: Diagnosis not present

## 2022-01-18 DIAGNOSIS — N400 Enlarged prostate without lower urinary tract symptoms: Secondary | ICD-10-CM

## 2022-01-18 DIAGNOSIS — R7989 Other specified abnormal findings of blood chemistry: Secondary | ICD-10-CM

## 2022-01-18 DIAGNOSIS — E559 Vitamin D deficiency, unspecified: Secondary | ICD-10-CM | POA: Diagnosis not present

## 2022-01-18 DIAGNOSIS — R5383 Other fatigue: Secondary | ICD-10-CM

## 2022-01-18 DIAGNOSIS — R7303 Prediabetes: Secondary | ICD-10-CM | POA: Diagnosis not present

## 2022-01-18 DIAGNOSIS — E538 Deficiency of other specified B group vitamins: Secondary | ICD-10-CM | POA: Diagnosis not present

## 2022-01-18 LAB — CBC WITH DIFFERENTIAL/PLATELET
Basophils Absolute: 0 10*3/uL (ref 0.0–0.1)
Basophils Relative: 0.6 % (ref 0.0–3.0)
Eosinophils Absolute: 0.6 10*3/uL (ref 0.0–0.7)
Eosinophils Relative: 9 % — ABNORMAL HIGH (ref 0.0–5.0)
HCT: 49.6 % (ref 39.0–52.0)
Hemoglobin: 16.5 g/dL (ref 13.0–17.0)
Lymphocytes Relative: 39.8 % (ref 12.0–46.0)
Lymphs Abs: 2.6 10*3/uL (ref 0.7–4.0)
MCHC: 33.2 g/dL (ref 30.0–36.0)
MCV: 94.3 fl (ref 78.0–100.0)
Monocytes Absolute: 0.4 10*3/uL (ref 0.1–1.0)
Monocytes Relative: 6.7 % (ref 3.0–12.0)
Neutro Abs: 2.8 10*3/uL (ref 1.4–7.7)
Neutrophils Relative %: 43.9 % (ref 43.0–77.0)
Platelets: 174 10*3/uL (ref 150.0–400.0)
RBC: 5.26 Mil/uL (ref 4.22–5.81)
RDW: 13.1 % (ref 11.5–15.5)
WBC: 6.5 10*3/uL (ref 4.0–10.5)

## 2022-01-18 LAB — COMPREHENSIVE METABOLIC PANEL
ALT: 32 U/L (ref 0–53)
AST: 23 U/L (ref 0–37)
Albumin: 4.3 g/dL (ref 3.5–5.2)
Alkaline Phosphatase: 65 U/L (ref 39–117)
BUN: 15 mg/dL (ref 6–23)
CO2: 28 mEq/L (ref 19–32)
Calcium: 9.6 mg/dL (ref 8.4–10.5)
Chloride: 104 mEq/L (ref 96–112)
Creatinine, Ser: 1.17 mg/dL (ref 0.40–1.50)
GFR: 59.5 mL/min — ABNORMAL LOW (ref >60.00)
Glucose, Bld: 111 mg/dL — ABNORMAL HIGH (ref 70–99)
Potassium: 4.6 mEq/L (ref 3.5–5.1)
Sodium: 140 mEq/L (ref 135–145)
Total Bilirubin: 1.1 mg/dL (ref 0.2–1.2)
Total Protein: 6.5 g/dL (ref 6.0–8.3)

## 2022-01-18 LAB — IBC + FERRITIN
Ferritin: 329.9 ng/mL — ABNORMAL HIGH (ref 22.0–322.0)
Iron: 147 ug/dL (ref 42–165)
Saturation Ratios: 46.5 % (ref 20.0–50.0)
TIBC: 316.4 ug/dL (ref 250.0–450.0)
Transferrin: 226 mg/dL (ref 212.0–360.0)

## 2022-01-18 LAB — LIPID PANEL
Cholesterol: 153 mg/dL (ref 0–200)
HDL: 43.6 mg/dL (ref >39.00)
LDL Cholesterol: 88 mg/dL (ref 0–99)
NonHDL: 109.58
Total CHOL/HDL Ratio: 4
Triglycerides: 110 mg/dL (ref 0.0–149.0)
VLDL: 22 mg/dL (ref 0.0–40.0)

## 2022-01-18 LAB — HEMOGLOBIN A1C: Hgb A1c MFr Bld: 6.2 % (ref 4.6–6.5)

## 2022-01-18 LAB — TSH: TSH: 1.08 u[IU]/mL (ref 0.35–5.50)

## 2022-01-18 LAB — VITAMIN D 25 HYDROXY (VIT D DEFICIENCY, FRACTURES): VITD: 36.64 ng/mL (ref 30.00–100.00)

## 2022-01-18 LAB — PSA: PSA: 2.22 ng/mL (ref 0.10–4.00)

## 2022-01-18 LAB — VITAMIN B12: Vitamin B-12: 577 pg/mL (ref 211–911)

## 2022-01-18 LAB — TESTOSTERONE: Testosterone: 304.37 ng/dL (ref 300.00–890.00)

## 2022-01-19 ENCOUNTER — Telehealth: Payer: Self-pay

## 2022-01-19 NOTE — Telephone Encounter (Signed)
Patient states he would like for [place] to fax his lab results to him at [phone], today if possible.  Patient states he would like for [place] to call or text him when we have faxed the results.  Patient states we may call or text him at [phone].

## 2022-01-29 DIAGNOSIS — H903 Sensorineural hearing loss, bilateral: Secondary | ICD-10-CM | POA: Diagnosis not present

## 2022-01-29 DIAGNOSIS — H6123 Impacted cerumen, bilateral: Secondary | ICD-10-CM | POA: Diagnosis not present

## 2022-03-05 ENCOUNTER — Telehealth: Payer: Self-pay | Admitting: Internal Medicine

## 2022-03-05 NOTE — Telephone Encounter (Signed)
Copied from [employer] [id number]. Topic: Medicare AWV >> Mar 05, 2022  2:39 PM Devoria Glassing wrote: Reason for CRM: Left message for patient to schedule Annual Wellness Visit.  Please schedule with Nurse Health Advisor Denisa O'Brien-Blaney, LPN at [facility]. This appt can be telephone or office visit.  Please call [phone] ask for [facility]

## 2022-03-16 ENCOUNTER — Ambulatory Visit (INDEPENDENT_AMBULATORY_CARE_PROVIDER_SITE_OTHER): Payer: Medicare HMO

## 2022-03-16 VITALS — Ht 66.73 in | Wt 167.0 lb

## 2022-03-16 DIAGNOSIS — Z0000 Encounter for general adult medical examination without abnormal findings: Secondary | ICD-10-CM

## 2022-03-16 NOTE — Patient Instructions (Addendum)
Brian Valencia , Thank you for taking time to come for your Medicare Wellness Visit. I appreciate your ongoing commitment to your health goals. Please review the following plan we discussed and let me know if I can assist you in the future.   These are the goals we discussed:  Goals       Patient Stated     I would like to do more gym exercises and/or use stationary bike (pt-stated)        This is a list of the screening recommended for you and due dates:  Health Maintenance  Topic Date Due   COVID-19 Vaccine (3 - Pfizer risk series) 04/01/2022*   Zoster (Shingles) Vaccine (1 of 2) 06/15/2022*   Flu Shot  10/07/2022*   Tetanus Vaccine  06/23/2028   Pneumonia Vaccine  Completed   Hepatitis C Screening: USPSTF Recommendation to screen - Ages 18-79 yo.  Completed   HPV Vaccine  Aged Out   Colon Cancer Screening  Discontinued  *Topic was postponed. The date shown is not the original due date.    Next appointment: Follow up in one year for your annual wellness visit.   Preventive Care 14 Years and Older, Male Preventive care refers to lifestyle choices and visits with your health care provider that can promote health and wellness. What does preventive care include? A yearly physical exam. This is also called an annual well check. Dental exams once or twice a year. Routine eye exams. Ask your health care provider how often you should have your eyes checked. Personal lifestyle choices, including: Daily care of your teeth and gums. Regular physical activity. Eating a healthy diet. Avoiding tobacco and drug use. Limiting alcohol use. Practicing safe sex. Taking low doses of aspirin every day. Taking vitamin and mineral supplements as recommended by your health care provider. What happens during an annual well check? The services and screenings done by your health care provider during your annual well check will depend on your age, overall health, lifestyle risk factors, and family  history of disease. Counseling  Your health care provider may ask you questions about your: Alcohol use. Tobacco use. Drug use. Emotional well-being. Home and relationship well-being. Sexual activity. Eating habits. History of falls. Memory and ability to understand (cognition). Work and work Statistician. Screening  You may have the following tests or measurements: Height, weight, and BMI. Blood pressure. Lipid and cholesterol levels. These may be checked every 5 years, or more frequently if you are over 52 years old. Skin check. Lung cancer screening. You may have this screening every year starting at age 18 if you have a 30-pack-year history of smoking and currently smoke or have quit within the past 15 years. Fecal occult blood test (FOBT) of the stool. You may have this test every year starting at age 35. Flexible sigmoidoscopy or colonoscopy. You may have a sigmoidoscopy every 5 years or a colonoscopy every 10 years starting at age 41. Prostate cancer screening. Recommendations will vary depending on your family history and other risks. Hepatitis C blood test. Hepatitis B blood test. Sexually transmitted disease (STD) testing. Diabetes screening. This is done by checking your blood sugar (glucose) after you have not eaten for a while (fasting). You may have this done every 1-3 years. Abdominal aortic aneurysm (AAA) screening. You may need this if you are a current or former smoker. Osteoporosis. You may be screened starting at age 100 if you are at high risk. Talk with your health care provider  about your test results, treatment options, and if necessary, the need for more tests. Vaccines  Your health care provider may recommend certain vaccines, such as: Influenza vaccine. This is recommended every year. Tetanus, diphtheria, and acellular pertussis (Tdap, Td) vaccine. You may need a Td booster every 10 years. Zoster vaccine. You may need this after age 26. Pneumococcal  13-valent conjugate (PCV13) vaccine. One dose is recommended after age 32. Pneumococcal polysaccharide (PPSV23) vaccine. One dose is recommended after age 24. Talk to your health care provider about which screenings and vaccines you need and how often you need them. This information is not intended to replace advice given to you by your health care provider. Make sure you discuss any questions you have with your health care provider. Document Released: 07/22/2015 Document Revised: 03/14/2016 Document Reviewed: 04/26/2015 Elsevier Interactive Patient Education  2017 [place] Prevention in the Home Falls can cause injuries. They can happen to people of all ages. There are many things you can do to make your home safe and to help prevent falls. What can I do on the outside of my home? Regularly fix the edges of walkways and driveways and fix any cracks. Remove anything that might make you trip as you walk through a door, such as a raised step or threshold. Trim any bushes or trees on the path to your home. Use bright outdoor lighting. Clear any walking paths of anything that might make someone trip, such as rocks or tools. Regularly check to see if handrails are loose or broken. Make sure that both sides of any steps have handrails. Any raised decks and porches should have guardrails on the edges. Have any leaves, snow, or ice cleared regularly. Use sand or salt on walking paths during winter. Clean up any spills in your garage right away. This includes oil or grease spills. What can I do in the bathroom? Use night lights. Install grab bars by the toilet and in the tub and shower. Do not use towel bars as grab bars. Use non-skid mats or decals in the tub or shower. If you need to sit down in the shower, use a plastic, non-slip stool. Keep the floor dry. Clean up any water that spills on the floor as soon as it happens. Remove soap buildup in the tub or shower regularly. Attach  bath mats securely with double-sided non-slip rug tape. Do not have throw rugs and other things on the floor that can make you trip. What can I do in the bedroom? Use night lights. Make sure that you have a light by your bed that is easy to reach. Do not use any sheets or blankets that are too big for your bed. They should not hang down onto the floor. Have a firm chair that has side arms. You can use this for support while you get dressed. Do not have throw rugs and other things on the floor that can make you trip. What can I do in the kitchen? Clean up any spills right away. Avoid walking on wet floors. Keep items that you use a lot in easy-to-reach places. If you need to reach something above you, use a strong step stool that has a grab bar. Keep electrical cords out of the way. Do not use floor polish or wax that makes floors slippery. If you must use wax, use non-skid floor wax. Do not have throw rugs and other things on the floor that can make you trip. What can I do  with my stairs? Do not leave any items on the stairs. Make sure that there are handrails on both sides of the stairs and use them. Fix handrails that are broken or loose. Make sure that handrails are as long as the stairways. Check any carpeting to make sure that it is firmly attached to the stairs. Fix any carpet that is loose or worn. Avoid having throw rugs at the top or bottom of the stairs. If you do have throw rugs, attach them to the floor with carpet tape. Make sure that you have a light switch at the top of the stairs and the bottom of the stairs. If you do not have them, ask someone to add them for you. What else can I do to help prevent falls? Wear shoes that: Do not have high heels. Have rubber bottoms. Are comfortable and fit you well. Are closed at the toe. Do not wear sandals. If you use a stepladder: Make sure that it is fully opened. Do not climb a closed stepladder. Make sure that both sides of the  stepladder are locked into place. Ask someone to hold it for you, if possible. Clearly mark and make sure that you can see: Any grab bars or handrails. First and last steps. Where the edge of each step is. Use tools that help you move around (mobility aids) if they are needed. These include: Canes. Walkers. Scooters. Crutches. Turn on the lights when you go into a dark area. Replace any light bulbs as soon as they burn out. Set up your furniture so you have a clear path. Avoid moving your furniture around. If any of your floors are uneven, fix them. If there are any pets around you, be aware of where they are. Review your medicines with your doctor. Some medicines can make you feel dizzy. This can increase your chance of falling. Ask your doctor what other things that you can do to help prevent falls. This information is not intended to replace advice given to you by your health care provider. Make sure you discuss any questions you have with your health care provider. Document Released: 04/21/2009 Document Revised: 12/01/2015 Document Reviewed: 07/30/2014 Elsevier Interactive Patient Education  2017 [employer].
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
## 2022-04-19 DIAGNOSIS — H2513 Age-related nuclear cataract, bilateral: Secondary | ICD-10-CM | POA: Diagnosis not present

## 2022-04-19 DIAGNOSIS — H5203 Hypermetropia, bilateral: Secondary | ICD-10-CM | POA: Diagnosis not present

## 2022-05-03 ENCOUNTER — Encounter: Payer: Self-pay | Admitting: Family Medicine

## 2022-05-03 ENCOUNTER — Ambulatory Visit (INDEPENDENT_AMBULATORY_CARE_PROVIDER_SITE_OTHER): Payer: Medicare HMO | Admitting: Family Medicine

## 2022-05-03 VITALS — BP 126/80 | HR 64 | Temp 97.5°F | Ht 66.73 in | Wt 165.4 lb

## 2022-05-03 DIAGNOSIS — E291 Testicular hypofunction: Secondary | ICD-10-CM | POA: Diagnosis not present

## 2022-05-03 DIAGNOSIS — K760 Fatty (change of) liver, not elsewhere classified: Secondary | ICD-10-CM | POA: Diagnosis not present

## 2022-05-03 DIAGNOSIS — I700 Atherosclerosis of aorta: Secondary | ICD-10-CM | POA: Diagnosis not present

## 2022-05-03 DIAGNOSIS — R351 Nocturia: Secondary | ICD-10-CM

## 2022-05-03 DIAGNOSIS — R7989 Other specified abnormal findings of blood chemistry: Secondary | ICD-10-CM | POA: Diagnosis not present

## 2022-05-03 NOTE — Patient Instructions (Addendum)
It was a pleasure meeting you today. Thank you for allowing me to take part in your health care.  Our goals for today as we discussed include:  For your increased urination at night Recommend stopping wine intake before bedtime Increase fluids during day.  Stop fluids at 6pm If any pain during urination, difficulty making stream, shortness of breath or lower extremity edema please notify MD   For your iron was high Stop multivitamins Will repeat your blood work at next visit   Please follow-up with PCP in 3 months  If you have any questions or concerns, please do not hesitate to call the office at [phone].  I look forward to our next visit and until then take care and stay safe.  Regards,   Carollee Leitz, MD   [facility]   Urinary Frequency, Adult Urinary frequency means urinating more often than usual. You may urinate every 1-2 hours even though you drink a normal amount of fluid and do not have a bladder infection or condition. Although you urinate more often than normal, the total amount of urine produced in a day is normal. With urinary frequency, you may have an urgent need to urinate often. The stress and anxiety of needing to find a bathroom quickly can make this urge worse. This condition may go away on its own, or you may need treatment at home. Home treatment may include bladder training, exercises, taking medicines, or making changes to your diet. Follow these instructions at home: Bladder health Your health care provider will tell you what to do to improve bladder health. You may be told to: Keep a bladder diary. Keep track of: What you eat and drink. How often you urinate. How much you urinate. Follow a bladder training program. This may include: Learning to delay going to the bathroom. Double urinating, also called voiding. This helps if you are not completely emptying your bladder. Scheduled voiding. Do Kegel exercises. Kegel exercises  strengthen the muscles that help control urination, which may help the condition.  Eating and drinking Follow instructions from your health care provider about eating or drinking restrictions. You may be told to: Avoid caffeine. Drink fewer fluids, especially alcohol. Avoid drinking in the evening. Avoid foods or drinks that may irritate the bladder. These include coffee, tea, soda, artificial sweeteners, citrus, tomato-based foods, and chocolate. Eat foods that help prevent or treat constipation. Constipation can make urinary frequency worse. You may need to take these actions to prevent or treat constipation: Drink enough fluid to keep your urine pale yellow. Take over-the-counter or prescription medicines. Eat foods that are high in fiber, such as beans, whole grains, and fresh fruits and vegetables. Limit foods that are high in fat and processed sugars, such as fried or sweet foods. General instructions Take over-the-counter and prescription medicines only as told by your health care provider. Keep all follow-up visits. This is important. Contact a health care provider if: You start urinating more often. You feel pain or irritation when you urinate. You notice blood in your urine. Your urine looks cloudy. You develop a fever. You begin vomiting. Get help right away if: You are unable to urinate. Summary Urinary frequency means urinating more often than usual. With urinary frequency, you may urinate every 1-2 hours even though you drink a normal amount of fluid and do not have a bladder infection or other bladder condition. Your health care provider may recommend that you keep a bladder diary, follow a bladder training program,  or make dietary changes. If told by your health care provider, do Kegel exercises to strengthen the muscles that help control urination. Take over-the-counter and prescription medicines only as told by your health care provider. Contact a health care provider  if your symptoms do not improve or get worse. This information is not intended to replace advice given to you by your health care provider. Make sure you discuss any questions you have with your health care provider. Document Revised: 01/29/2020 Document Reviewed: 01/29/2020 Elsevier Patient Education  [place].

## 2022-05-03 NOTE — Progress Notes (Unsigned)
    SUBJECTIVE:   CHIEF COMPLAINT / HPI: transfer of care  Patient presents to clinic to transfer care.  No acute concerns today.  PERTINENT  PMH / PSH: ***  OBJECTIVE:   BP 126/80 (BP Location: Left Arm, Patient Position: Sitting, Cuff Size: Normal)   Pulse 64   Temp (!) 97.5 F (36.4 C) (Oral)   Ht 5' 6.73" (1.695 m)   Wt 165 lb 6.4 oz (75 kg)   SpO2 98%   BMI 26.12 kg/m    General: Alert, no acute distress Cardio: Normal S1 and S2, RRR, no r/m/g Pulm: CTAB, normal work of breathing Abdomen: Bowel sounds normal. Abdomen soft and non-tender.  Extremities: No peripheral edema.  Neuro: Cranial nerves grossly intact   ASSESSMENT/PLAN:   No problem-specific Assessment & Plan notes found for this encounter.     Carollee Leitz, MD [place]

## 2022-05-14 ENCOUNTER — Encounter: Payer: Self-pay | Admitting: Family Medicine

## 2022-05-14 DIAGNOSIS — R7989 Other specified abnormal findings of blood chemistry: Secondary | ICD-10-CM | POA: Insufficient documentation

## 2022-05-14 DIAGNOSIS — R351 Nocturia: Secondary | ICD-10-CM | POA: Insufficient documentation

## 2022-05-14 NOTE — Assessment & Plan Note (Signed)
Recent review of labs, ferritin elevated. Likely in setting of Testosterone use -Plan to repeat Ferritin level at next visit

## 2022-05-14 NOTE — Assessment & Plan Note (Signed)
Not currently on Testosterone injection or gel.  Previously followed by Urology -Continue Cialis as needed -Follow up with Urology as needed

## 2022-05-14 NOTE — Assessment & Plan Note (Signed)
Aortic atherosclerosis noted on CT abdomen from 01/2021.  -Previously on ASA was discontinued by former PCP -Not currently on statin therapy, may not be beneficial given advancing age. -We will discuss ASA and statin therapy at next visit.

## 2022-05-14 NOTE — Assessment & Plan Note (Signed)
Will need repeat LFT's in future Follow up with electrography and GI referral in future for baseline

## 2022-05-14 NOTE — Assessment & Plan Note (Signed)
Likely secondary to increase in fluid intake.  Low suspicion for cardiac etiology given euvolemic on exam and no cardiac symptoms -Decrease Etoh intake at night -Decrease fluids prior to bedtime -Follow up with PCP in 3 months or sooner if symptoms worsen

## 2022-07-16 DIAGNOSIS — H524 Presbyopia: Secondary | ICD-10-CM | POA: Diagnosis not present

## 2022-08-07 ENCOUNTER — Ambulatory Visit (INDEPENDENT_AMBULATORY_CARE_PROVIDER_SITE_OTHER): Payer: Medicare HMO | Admitting: Family Medicine

## 2022-08-07 ENCOUNTER — Encounter: Payer: Self-pay | Admitting: Family Medicine

## 2022-08-07 VITALS — BP 128/80 | HR 71 | Temp 98.0°F | Resp 14 | Ht 66.0 in | Wt 165.2 lb

## 2022-08-07 DIAGNOSIS — E559 Vitamin D deficiency, unspecified: Secondary | ICD-10-CM | POA: Diagnosis not present

## 2022-08-07 DIAGNOSIS — R7303 Prediabetes: Secondary | ICD-10-CM | POA: Diagnosis not present

## 2022-08-07 DIAGNOSIS — I700 Atherosclerosis of aorta: Secondary | ICD-10-CM | POA: Diagnosis not present

## 2022-08-07 DIAGNOSIS — R7989 Other specified abnormal findings of blood chemistry: Secondary | ICD-10-CM

## 2022-08-07 DIAGNOSIS — R351 Nocturia: Secondary | ICD-10-CM | POA: Diagnosis not present

## 2022-08-07 NOTE — Patient Instructions (Signed)
It was a pleasure meeting you today. Thank you for allowing me to take part in your health care.  Our goals for today as we discussed include:  Schedule fasting lab appointment for next week.  Fast for 12 hours  Continue to limit fluids prior to bedtime Glad symptoms of nightime awakening with voiding has improved with the decrease in fluids,  Continue to hydrate throughout the day  Consider Calcium score CT to help with decision to take statin  If you have any questions or concerns, please do not hesitate to call the office at [phone].  I look forward to our next visit and until then take care and stay safe.  Regards,   Carollee Leitz, MD   [facility]

## 2022-08-07 NOTE — Assessment & Plan Note (Signed)
Aortic atherosclerosis noted on CT abdomen from 01/2021.  Check fasting lipids Patient will decide if wanting to initiate statin after labs Consider Calcium score CT

## 2022-08-07 NOTE — Assessment & Plan Note (Signed)
Check Vitamin D levels History of Osteopenia Consider Dexa

## 2022-08-07 NOTE — Assessment & Plan Note (Signed)
Check CBC,Ferritin levels and Cmet

## 2022-08-07 NOTE — Assessment & Plan Note (Addendum)
Improved with decreasing fluid intake prior to bedtime.  Continue to avoid ETOH prior to bedtime Limit fluids after 6p

## 2022-08-07 NOTE — Progress Notes (Signed)
   SUBJECTIVE:   Chief Complaint  Patient presents with   Medical Management of Chronic Issues   Nocturia   HPI Patient presents to clinic for follow-up nocturia.  Has decreased fluid intake prior to bedtime.  Reports no nighttime awakenings when compliant with no fluid intake.  If taking fluids prior to bed will still get up at night once to void.  Denies any hesitancy or weak stream.  Denies any urinary symptoms.   PERTINENT PMH / PSH: Nocturia Aortic atherosclerosis Elevated Ferritin   OBJECTIVE:  BP 128/80   Pulse 71   Temp 98 F (36.7 C) (Temporal)   Resp 14   Ht '5\' 6"'$  (1.676 m)   Wt 165 lb 3.2 oz (74.9 kg)   SpO2 99%   BMI 26.66 kg/m    Physical Exam Vitals reviewed.  Constitutional:      General: He is not in acute distress.    Appearance: Normal appearance. He is normal weight. He is not ill-appearing, toxic-appearing or diaphoretic.  Eyes:     General:        Right eye: No discharge.        Left eye: No discharge.  Cardiovascular:     Rate and Rhythm: Normal rate and regular rhythm.     Heart sounds: Normal heart sounds.  Pulmonary:     Effort: Pulmonary effort is normal.     Breath sounds: Normal breath sounds.  Abdominal:     General: Bowel sounds are normal.  Musculoskeletal:        General: Normal range of motion.     Cervical back: Normal range of motion.  Skin:    General: Skin is warm and dry.  Neurological:     Mental Status: He is alert and oriented to person, place, and time. Mental status is at baseline.  Psychiatric:        Mood and Affect: Mood normal.        Behavior: Behavior normal.        Thought Content: Thought content normal.        Judgment: Judgment normal.     ASSESSMENT/PLAN:  Nocturia Assessment & Plan: Improved with decreasing fluid intake prior to bedtime.  Continue to avoid ETOH prior to bedtime Limit fluids after 6p   Aortic atherosclerosis ([place]) Assessment & Plan: Aortic atherosclerosis noted on CT abdomen  from 01/2021.  Check fasting lipids Patient will decide if wanting to initiate statin after labs Consider Calcium score CT  Orders: -     Lipid panel; Future  Vitamin D deficiency Assessment & Plan: Check Vitamin D levels History of Osteopenia Consider Dexa  Orders: -     VITAMIN D 25 Hydroxy (Vit-D Deficiency, Fractures)  Elevated ferritin level Assessment & Plan: Check CBC,Ferritin levels and Cmet   Orders: -     CBC with Differential/Platelet; Future -     Ferritin; Future -     Comprehensive metabolic panel; Future  Prediabetes -     Hemoglobin A1c; Future -     Vitamin B12; Future   PDMP reviewed  Return in about 1 week (around 08/14/2022), or if symptoms worsen or fail to improve, for LAB.  Carollee Leitz, MD

## 2022-08-08 NOTE — Addendum Note (Signed)
Addended by: Leeanne Rio on: 08/08/2022 08:54 AM   Modules accepted: Orders

## 2022-08-14 ENCOUNTER — Other Ambulatory Visit (INDEPENDENT_AMBULATORY_CARE_PROVIDER_SITE_OTHER): Payer: Medicare HMO

## 2022-08-14 DIAGNOSIS — I700 Atherosclerosis of aorta: Secondary | ICD-10-CM

## 2022-08-14 DIAGNOSIS — R7989 Other specified abnormal findings of blood chemistry: Secondary | ICD-10-CM | POA: Diagnosis not present

## 2022-08-14 DIAGNOSIS — R7303 Prediabetes: Secondary | ICD-10-CM | POA: Diagnosis not present

## 2022-08-14 DIAGNOSIS — E559 Vitamin D deficiency, unspecified: Secondary | ICD-10-CM

## 2022-08-14 LAB — CBC WITH DIFFERENTIAL/PLATELET
Basophils Absolute: 0 10*3/uL (ref 0.0–0.1)
Basophils Relative: 0.4 % (ref 0.0–3.0)
Eosinophils Absolute: 0.7 10*3/uL (ref 0.0–0.7)
Eosinophils Relative: 9.1 % — ABNORMAL HIGH (ref 0.0–5.0)
HCT: 48.2 % (ref 39.0–52.0)
Hemoglobin: 16.2 g/dL (ref 13.0–17.0)
Lymphocytes Relative: 44.7 % (ref 12.0–46.0)
Lymphs Abs: 3.6 10*3/uL (ref 0.7–4.0)
MCHC: 33.7 g/dL (ref 30.0–36.0)
MCV: 93.6 fl (ref 78.0–100.0)
Monocytes Absolute: 0.5 10*3/uL (ref 0.1–1.0)
Monocytes Relative: 6.4 % (ref 3.0–12.0)
Neutro Abs: 3.2 10*3/uL (ref 1.4–7.7)
Neutrophils Relative %: 39.4 % — ABNORMAL LOW (ref 43.0–77.0)
Platelets: 176 10*3/uL (ref 150.0–400.0)
RBC: 5.15 Mil/uL (ref 4.22–5.81)
RDW: 12.7 % (ref 11.5–15.5)
WBC: 8.1 10*3/uL (ref 4.0–10.5)

## 2022-08-14 LAB — HEMOGLOBIN A1C: Hgb A1c MFr Bld: 6 % (ref 4.6–6.5)

## 2022-08-14 LAB — LIPID PANEL
Cholesterol: 158 mg/dL (ref 0–200)
HDL: 40.9 mg/dL (ref >39.00)
LDL Cholesterol: 87 mg/dL (ref 0–99)
NonHDL: 116.77
Total CHOL/HDL Ratio: 4
Triglycerides: 149 mg/dL (ref 0.0–149.0)
VLDL: 29.8 mg/dL (ref 0.0–40.0)

## 2022-08-14 LAB — COMPREHENSIVE METABOLIC PANEL
ALT: 23 U/L (ref 0–53)
AST: 17 U/L (ref 0–37)
Albumin: 4.1 g/dL (ref 3.5–5.2)
Alkaline Phosphatase: 68 U/L (ref 39–117)
BUN: 15 mg/dL (ref 6–23)
CO2: 26 mEq/L (ref 19–32)
Calcium: 9.4 mg/dL (ref 8.4–10.5)
Chloride: 104 mEq/L (ref 96–112)
Creatinine, Ser: 1.21 mg/dL (ref 0.40–1.50)
GFR: 56.92 mL/min — ABNORMAL LOW (ref >60.00)
Glucose, Bld: 119 mg/dL — ABNORMAL HIGH (ref 70–99)
Potassium: 4.4 mEq/L (ref 3.5–5.1)
Sodium: 140 mEq/L (ref 135–145)
Total Bilirubin: 0.9 mg/dL (ref 0.2–1.2)
Total Protein: 6.3 g/dL (ref 6.0–8.3)

## 2022-08-14 LAB — FERRITIN: Ferritin: 368.1 ng/mL — ABNORMAL HIGH (ref 22.0–322.0)

## 2022-08-14 LAB — VITAMIN B12: Vitamin B-12: 632 pg/mL (ref 211–911)

## 2022-08-14 LAB — VITAMIN D 25 HYDROXY (VIT D DEFICIENCY, FRACTURES): VITD: 32.43 ng/mL (ref 30.00–100.00)

## 2022-08-27 DIAGNOSIS — L821 Other seborrheic keratosis: Secondary | ICD-10-CM | POA: Diagnosis not present

## 2022-08-27 DIAGNOSIS — L298 Other pruritus: Secondary | ICD-10-CM | POA: Diagnosis not present

## 2022-08-27 DIAGNOSIS — Z859 Personal history of malignant neoplasm, unspecified: Secondary | ICD-10-CM | POA: Diagnosis not present

## 2022-08-27 DIAGNOSIS — L570 Actinic keratosis: Secondary | ICD-10-CM | POA: Diagnosis not present

## 2022-11-08 DIAGNOSIS — M79672 Pain in left foot: Secondary | ICD-10-CM | POA: Diagnosis not present

## 2022-11-08 DIAGNOSIS — M79671 Pain in right foot: Secondary | ICD-10-CM | POA: Diagnosis not present

## 2022-11-08 DIAGNOSIS — M21622 Bunionette of left foot: Secondary | ICD-10-CM | POA: Diagnosis not present

## 2022-11-08 DIAGNOSIS — L909 Atrophic disorder of skin, unspecified: Secondary | ICD-10-CM | POA: Diagnosis not present

## 2022-11-08 DIAGNOSIS — B351 Tinea unguium: Secondary | ICD-10-CM | POA: Diagnosis not present

## 2023-03-20 ENCOUNTER — Ambulatory Visit (INDEPENDENT_AMBULATORY_CARE_PROVIDER_SITE_OTHER): Payer: Medicare HMO | Admitting: *Deleted

## 2023-03-20 VITALS — Ht 66.0 in | Wt 164.0 lb

## 2023-03-20 DIAGNOSIS — Z0000 Encounter for general adult medical examination without abnormal findings: Secondary | ICD-10-CM | POA: Diagnosis not present
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
## 2023-03-20 NOTE — Patient Instructions (Signed)
Brian Valencia , Thank you for taking time to come for your Medicare Wellness Visit. I appreciate your ongoing commitment to your health goals. Please review the following plan we discussed and let me know if I can assist you in the future.   Referrals/Orders/Follow-Ups/Clinician Recommendations: None  This is a list of the screening recommended for you and due dates:  Health Maintenance  Topic Date Due   Zoster (Shingles) Vaccine (1 of 2) Never done   COVID-19 Vaccine (3 - Pfizer risk series) 10/19/2019   Flu Shot  02/07/2023   Medicare Annual Wellness Visit  03/19/2024   DTaP/Tdap/Td vaccine (3 - Td or Tdap) 06/23/2028   Pneumonia Vaccine  Completed   HPV Vaccine  Aged Out   Colon Cancer Screening  Discontinued   Hepatitis C Screening  Discontinued    Advanced directives: (Copy Requested) Please bring a copy of your health care power of attorney and living will to the office to be added to your chart at your convenience.  Next Medicare Annual Wellness Visit scheduled for next year: Yes 03/23/24 @ 9:00

## 2023-09-30 ENCOUNTER — Encounter: Payer: Self-pay | Admitting: Family Medicine

## 2023-09-30 ENCOUNTER — Ambulatory Visit: Admitting: Family Medicine

## 2023-09-30 VITALS — BP 136/72 | HR 76 | Temp 97.6°F | Resp 20 | Ht 66.0 in | Wt 169.4 lb

## 2023-09-30 DIAGNOSIS — I700 Atherosclerosis of aorta: Secondary | ICD-10-CM | POA: Diagnosis not present

## 2023-09-30 DIAGNOSIS — E559 Vitamin D deficiency, unspecified: Secondary | ICD-10-CM

## 2023-09-30 DIAGNOSIS — R7309 Other abnormal glucose: Secondary | ICD-10-CM | POA: Diagnosis not present

## 2023-09-30 DIAGNOSIS — N400 Enlarged prostate without lower urinary tract symptoms: Secondary | ICD-10-CM

## 2023-09-30 DIAGNOSIS — R7989 Other specified abnormal findings of blood chemistry: Secondary | ICD-10-CM | POA: Diagnosis not present

## 2023-09-30 DIAGNOSIS — N529 Male erectile dysfunction, unspecified: Secondary | ICD-10-CM | POA: Diagnosis not present

## 2023-09-30 DIAGNOSIS — E538 Deficiency of other specified B group vitamins: Secondary | ICD-10-CM | POA: Diagnosis not present

## 2023-09-30 LAB — CBC WITH DIFFERENTIAL/PLATELET
Basophils Absolute: 0 10*3/uL (ref 0.0–0.1)
Basophils Relative: 0.3 % (ref 0.0–3.0)
Eosinophils Absolute: 0.7 10*3/uL (ref 0.0–0.7)
Eosinophils Relative: 9.3 % — ABNORMAL HIGH (ref 0.0–5.0)
HCT: 48.7 % (ref 39.0–52.0)
Hemoglobin: 16.2 g/dL (ref 13.0–17.0)
Lymphocytes Relative: 29.6 % (ref 12.0–46.0)
Lymphs Abs: 2.1 10*3/uL (ref 0.7–4.0)
MCHC: 33.3 g/dL (ref 30.0–36.0)
MCV: 94.5 fl (ref 78.0–100.0)
Monocytes Absolute: 0.6 10*3/uL (ref 0.1–1.0)
Monocytes Relative: 8.3 % (ref 3.0–12.0)
Neutro Abs: 3.8 10*3/uL (ref 1.4–7.7)
Neutrophils Relative %: 52.5 % (ref 43.0–77.0)
Platelets: 190 10*3/uL (ref 150.0–400.0)
RBC: 5.16 Mil/uL (ref 4.22–5.81)
RDW: 12.8 % (ref 11.5–15.5)
WBC: 7.2 10*3/uL (ref 4.0–10.5)

## 2023-09-30 LAB — COMPREHENSIVE METABOLIC PANEL
ALT: 26 U/L (ref 0–53)
AST: 20 U/L (ref 0–37)
Albumin: 4.3 g/dL (ref 3.5–5.2)
Alkaline Phosphatase: 68 U/L (ref 39–117)
BUN: 15 mg/dL (ref 6–23)
CO2: 29 meq/L (ref 19–32)
Calcium: 9.6 mg/dL (ref 8.4–10.5)
Chloride: 100 meq/L (ref 96–112)
Creatinine, Ser: 1.15 mg/dL (ref 0.40–1.50)
GFR: 60.02 mL/min (ref >60.00)
Glucose, Bld: 124 mg/dL — ABNORMAL HIGH (ref 70–99)
Potassium: 4.3 meq/L (ref 3.5–5.1)
Sodium: 138 meq/L (ref 135–145)
Total Bilirubin: 1.3 mg/dL — ABNORMAL HIGH (ref 0.2–1.2)
Total Protein: 6.9 g/dL (ref 6.0–8.3)

## 2023-09-30 LAB — LIPID PANEL
Cholesterol: 162 mg/dL (ref 0–200)
HDL: 41.5 mg/dL (ref >39.00)
LDL Cholesterol: 80 mg/dL (ref 0–99)
NonHDL: 120.62
Total CHOL/HDL Ratio: 4
Triglycerides: 202 mg/dL — ABNORMAL HIGH (ref 0.0–149.0)
VLDL: 40.4 mg/dL — ABNORMAL HIGH (ref 0.0–40.0)

## 2023-09-30 LAB — VITAMIN B12: Vitamin B-12: 370 pg/mL (ref 211–911)

## 2023-09-30 LAB — VITAMIN D 25 HYDROXY (VIT D DEFICIENCY, FRACTURES): VITD: 45.28 ng/mL (ref 30.00–100.00)

## 2023-09-30 LAB — HEMOGLOBIN A1C: Hgb A1c MFr Bld: 6 % (ref 4.6–6.5)

## 2023-09-30 LAB — FERRITIN: Ferritin: 376.7 ng/mL — ABNORMAL HIGH (ref 22.0–322.0)

## 2023-09-30 MED ORDER — TADALAFIL 10 MG PO TABS: 10.0000 mg | ORAL_TABLET | Freq: Every day | ORAL | 2 refills | Status: DC | PRN

## 2023-09-30 NOTE — Patient Instructions (Addendum)
 It was a pleasure meeting you today. Thank you for allowing me to take part in your health care.  Our goals for today as we discussed include:  We will get some labs today.  If they are abnormal or we need to do something about them, I will call you.  If they are normal, I will send you a message on MyChart (if it is active) or a letter in the mail.  If you don't hear from [place] in 2 weeks, please call the office at the number below.   Refill sent for requested medication    This is a list of the screening recommended for you and due dates:  Health Maintenance  Topic Date Due   Zoster (Shingles) Vaccine (1 of 2) Never done   COVID-19 Vaccine (3 - Pfizer risk series) 10/19/2019   Medicare Annual Wellness Visit  03/19/2024   DTaP/Tdap/Td vaccine (3 - Td or Tdap) 06/23/2028   Pneumonia Vaccine  Completed   Flu Shot  Completed   HPV Vaccine  Aged Out   Colon Cancer Screening  Discontinued   Hepatitis C Screening  Discontinued     If you have any questions or concerns, please do not hesitate to call the office at [phone].  I look forward to our next visit and until then take care and stay safe.  Regards,   Dana Allan, MD   [facility]

## 2023-09-30 NOTE — Progress Notes (Signed)
 SUBJECTIVE:   Chief Complaint  Patient presents with   Medical Management of Chronic Issues    Follow up   HPI Presents for follow up chronic disease management  Discussed the use of AI scribe software for clinical note transcription with the patient, who gave verbal consent to proceed.  History of Present Illness Brian Valencia is an 83 year old male who presents for a routine physical exam.  He describes general aches and pains typical for his age but remains active, engaging in activities such as painting his house and yard work. He has not experienced any falls, although he occasionally trips due to a weak back.  He has a history of borderline diabetes and is concerned about potentially becoming diabetic. He experiences nervousness and fatigue when he does not eat. He is not fasting today, having had a cup of coffee in the morning.  He experiences nocturia, getting up once or twice a night to urinate after consuming a small amount of wine in the evening, which he finds helps him sleep.  He takes tadalafil daily for erectile dysfunction and requests a refill. There are no changes in symptoms or medication regimen.  He continues to take vitamin E, glucosamine, vitamin B, and vitamin D supplements. No chest pain, shortness of breath, constipation, diarrhea, blood in urine or stool, or swelling of the legs.    PERTINENT PMH / PSH: As above  OBJECTIVE:  BP 136/72   Pulse 76   Temp 97.6 F (36.4 C)   Resp 20   Ht 5\' 6"  (1.676 m)   Wt 169 lb 6 oz (76.8 kg)   SpO2 99%   BMI 27.34 kg/m    Physical Exam Vitals reviewed.  HENT:     Head: Normocephalic.     Right Ear: Tympanic membrane, ear canal and external ear normal.     Left Ear: Tympanic membrane, ear canal and external ear normal.     Nose: Nose normal.     Mouth/Throat:     Mouth: Mucous membranes are moist.  Eyes:     Conjunctiva/sclera: Conjunctivae normal.     Pupils: Pupils are equal, round, and reactive  to light.  Neck:     Thyroid: No thyromegaly or thyroid tenderness.     Vascular: No carotid bruit.  Cardiovascular:     Rate and Rhythm: Normal rate and regular rhythm.     Pulses: Normal pulses.     Heart sounds: Normal heart sounds.  Pulmonary:     Effort: Pulmonary effort is normal.     Breath sounds: Normal breath sounds.  Abdominal:     General: Abdomen is flat. Bowel sounds are normal.     Palpations: Abdomen is soft.  Musculoskeletal:        General: Normal range of motion.     Cervical back: Normal range of motion and neck supple.     Right lower leg: No edema.     Left lower leg: No edema.  Lymphadenopathy:     Cervical: No cervical adenopathy.  Neurological:     Mental Status: He is alert.  Psychiatric:        Mood and Affect: Mood normal.        Behavior: Behavior normal.        Thought Content: Thought content normal.        Judgment: Judgment normal.           09/30/2023    8:50 AM 03/20/2023  8:25 AM 08/07/2022   10:03 AM 05/03/2022    9:31 AM 03/16/2022    8:44 AM  Depression screen PHQ 2/9  Decreased Interest 0 0 0 0 0  Down, Depressed, Hopeless 0 0 0 0 0  PHQ - 2 Score 0 0 0 0 0  Altered sleeping 0 0 0    Tired, decreased energy 1 1 1     Change in appetite 0 0 0    Feeling bad or failure about yourself  0 0 0    Trouble concentrating 0 0 0    Moving slowly or fidgety/restless 0 0 0    Suicidal thoughts 0 0 0    PHQ-9 Score 1 1 1     Difficult doing work/chores Not difficult at all Not difficult at all Not difficult at all        09/30/2023    8:50 AM 08/07/2022   10:03 AM 04/15/2020    9:06 AM 01/14/2020    2:59 PM  GAD 7 : Generalized Anxiety Score  Nervous, Anxious, on Edge 0 0 0 0  Control/stop worrying 0 0 0 0  Worry too much - different things 0 1 0 0  Trouble relaxing 0 0 0 0  Restless 1 1 0 0  Easily annoyed or irritable 0 0 0 0  Afraid - awful might happen 0 0 0 0  Total GAD 7 Score 1 2 0 0  Anxiety Difficulty Not difficult at all  Not difficult at all Not difficult at all Not difficult at all    ASSESSMENT/PLAN:  Aortic atherosclerosis [facility]) Assessment & Plan: Aortic atherosclerosis noted on CT abdomen from 01/2021.  Check fasting lipids Declined statin   Orders: -     Comprehensive metabolic panel with GFR -     Lipid panel  Vitamin D deficiency Assessment & Plan: Check Vitamin D level  Orders: -     VITAMIN D 25 Hydroxy (Vit-D Deficiency, Fractures)  B12 deficiency -     Vitamin B12  Elevated ferritin Assessment & Plan: Likely reactive Check CBC,Ferritin levels and Cmet   Orders: -     CBC with Differential/Platelet -     Ferritin  Erectile dysfunction, unspecified erectile dysfunction type Assessment & Plan: Takes tadalafil daily with no issues. Requests refill. - Refill tadalafil prescription.  Orders: -     Tadalafil; Take 1 tablet (10 mg total) by mouth daily as needed for erectile dysfunction.  Dispense: 10 tablet; Refill: 2  Abnormal glucose Assessment & Plan: Symptoms suggest possible blood glucose fluctuations. No diabetes medication currently prescribed. - Monitor blood glucose levels. - Discuss dietary habits and importance of regular meals.  Orders: -     Hemoglobin A1c  Benign prostatic hyperplasia, unspecified whether lower urinary tract symptoms present Assessment & Plan: No significant worsening of symptoms. Nocturia attributed to fluid intake before bed. Condition manageable. -Continue to limit fluids prior to bedtime     PDMP reviewed  Return if symptoms worsen or fail to improve, for PCP.  Dana Allan, MD

## 2023-10-01 ENCOUNTER — Telehealth: Payer: Self-pay | Admitting: Family Medicine

## 2023-10-01 NOTE — Telephone Encounter (Signed)
 Labs printed off and placed in front off. Called pt and notified him that lab results are at front office ready for pick up

## 2023-10-01 NOTE — Telephone Encounter (Signed)
 Copied from CRM [id number]. Topic: Clinical - Lab/Test Results >> Oct 01, 2023  9:54 AM Marica Otter wrote: Reason for CRM: Patient calling to request a copy of his labs that were done on 3/24, patient states he wants to pick up a copy today. Thanks  Kendarrius [phone]

## 2023-10-06 ENCOUNTER — Encounter: Payer: Self-pay | Admitting: Family Medicine

## 2023-10-06 DIAGNOSIS — E538 Deficiency of other specified B group vitamins: Secondary | ICD-10-CM | POA: Insufficient documentation

## 2023-10-06 DIAGNOSIS — N529 Male erectile dysfunction, unspecified: Secondary | ICD-10-CM | POA: Insufficient documentation

## 2023-10-06 DIAGNOSIS — R7309 Other abnormal glucose: Secondary | ICD-10-CM | POA: Insufficient documentation

## 2023-10-06 NOTE — Assessment & Plan Note (Signed)
 Aortic atherosclerosis noted on CT abdomen from 01/2021.  Check fasting lipids Declined statin

## 2023-10-06 NOTE — Assessment & Plan Note (Signed)
 Takes tadalafil daily with no issues. Requests refill. - Refill tadalafil prescription.

## 2023-10-06 NOTE — Assessment & Plan Note (Addendum)
 Likely reactive Check CBC,Ferritin levels and Cmet

## 2023-10-06 NOTE — Assessment & Plan Note (Addendum)
 No significant worsening of symptoms. Nocturia attributed to fluid intake before bed. Condition manageable. -Continue to limit fluids prior to bedtime

## 2023-10-06 NOTE — Assessment & Plan Note (Signed)
 Symptoms suggest possible blood glucose fluctuations. No diabetes medication currently prescribed. - Monitor blood glucose levels. - Discuss dietary habits and importance of regular meals.

## 2023-10-06 NOTE — Assessment & Plan Note (Signed)
 Check Vitamin D level

## 2023-11-06 DIAGNOSIS — S39012A Strain of muscle, fascia and tendon of lower back, initial encounter: Secondary | ICD-10-CM | POA: Diagnosis not present

## 2023-11-06 DIAGNOSIS — R0781 Pleurodynia: Secondary | ICD-10-CM | POA: Diagnosis not present

## 2023-11-06 DIAGNOSIS — W19XXXA Unspecified fall, initial encounter: Secondary | ICD-10-CM | POA: Diagnosis not present

## 2023-11-06 DIAGNOSIS — S20211A Contusion of right front wall of thorax, initial encounter: Secondary | ICD-10-CM | POA: Diagnosis not present

## 2024-03-18 ENCOUNTER — Encounter: Payer: Self-pay | Admitting: *Deleted

## 2024-03-23 ENCOUNTER — Ambulatory Visit (INDEPENDENT_AMBULATORY_CARE_PROVIDER_SITE_OTHER): Payer: Medicare HMO | Admitting: *Deleted

## 2024-03-23 VITALS — Ht 66.0 in | Wt 166.0 lb

## 2024-03-23 DIAGNOSIS — Z0000 Encounter for general adult medical examination without abnormal findings: Secondary | ICD-10-CM | POA: Diagnosis not present
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
## 2024-03-23 NOTE — Patient Instructions (Signed)
 [name],  Thank you for taking the time for your Medicare Wellness Visit. I appreciate your continued commitment to your health goals. Please review the care plan we discussed, and feel free to reach out if I can assist you further.  Medicare recommends these wellness visits once per year to help you and your care team stay ahead of potential health issues. These visits are designed to focus on prevention, allowing your provider to concentrate on managing your acute and chronic conditions during your regular appointments.  Please note that Annual Wellness Visits do not include a physical exam. Some assessments may be limited, especially if the visit was conducted virtually. If needed, we may recommend a separate in-person follow-up with your provider.  Ongoing Care Seeing your primary care provider every 3 to 6 months helps us  monitor your health and provide consistent, personalized care.  Remember to update your flu, covid and shingles vaccines.  Referrals If a referral was made during today's visit and you haven't received any updates within two weeks, please contact the referred provider directly to check on the status.  Recommended Screenings:  Health Maintenance  Topic Date Due   Zoster (Shingles) Vaccine (1 of 2) Never done   COVID-19 Vaccine (3 - Pfizer risk series) 10/19/2019   Flu Shot  02/07/2024   Medicare Annual Wellness Visit  03/23/2025   DTaP/Tdap/Td vaccine (3 - Td or Tdap) 06/23/2028   Pneumococcal Vaccine for age over 67  Completed   HPV Vaccine  Aged Out   Meningitis B Vaccine  Aged Out   Colon Cancer Screening  Discontinued   Hepatitis C Screening  Discontinued       03/23/2024    9:07 AM  Advanced Directives  Does Patient Have a Medical Advance Directive? Yes  Type of Estate agent of [place];Living will  Copy of Healthcare Power of Attorney in Chart? No - copy requested   Advance Care Planning is important because it: Ensures  you receive medical care that aligns with your values, goals, and preferences. Provides guidance to your family and loved ones, reducing the emotional burden of decision-making during critical moments.  Vision: Annual vision screenings are recommended for early detection of glaucoma, cataracts, and diabetic retinopathy. These exams can also reveal signs of chronic conditions such as diabetes and high blood pressure.  Dental: Annual dental screenings help detect early signs of oral cancer, gum disease, and other conditions linked to overall health, including heart disease and diabetes.  Please see the attached documents for additional preventive care recommendations.

## 2024-03-30 ENCOUNTER — Other Ambulatory Visit: Payer: Self-pay

## 2024-03-31 ENCOUNTER — Ambulatory Visit: Payer: Self-pay

## 2024-03-31 ENCOUNTER — Ambulatory Visit (INDEPENDENT_AMBULATORY_CARE_PROVIDER_SITE_OTHER)

## 2024-03-31 VITALS — BP 126/82 | HR 64 | Temp 98.3°F | Ht 67.0 in | Wt 162.4 lb

## 2024-03-31 DIAGNOSIS — L219 Seborrheic dermatitis, unspecified: Secondary | ICD-10-CM | POA: Diagnosis not present

## 2024-03-31 DIAGNOSIS — R5383 Other fatigue: Secondary | ICD-10-CM | POA: Diagnosis not present

## 2024-03-31 DIAGNOSIS — N529 Male erectile dysfunction, unspecified: Secondary | ICD-10-CM

## 2024-03-31 DIAGNOSIS — R0683 Snoring: Secondary | ICD-10-CM | POA: Insufficient documentation

## 2024-03-31 DIAGNOSIS — Z13220 Encounter for screening for lipoid disorders: Secondary | ICD-10-CM | POA: Diagnosis not present

## 2024-03-31 DIAGNOSIS — I8393 Asymptomatic varicose veins of bilateral lower extremities: Secondary | ICD-10-CM | POA: Diagnosis not present

## 2024-03-31 DIAGNOSIS — R351 Nocturia: Secondary | ICD-10-CM | POA: Diagnosis not present

## 2024-03-31 DIAGNOSIS — K449 Diaphragmatic hernia without obstruction or gangrene: Secondary | ICD-10-CM

## 2024-03-31 DIAGNOSIS — K760 Fatty (change of) liver, not elsewhere classified: Secondary | ICD-10-CM

## 2024-03-31 DIAGNOSIS — R7989 Other specified abnormal findings of blood chemistry: Secondary | ICD-10-CM

## 2024-03-31 DIAGNOSIS — R296 Repeated falls: Secondary | ICD-10-CM | POA: Diagnosis not present

## 2024-03-31 DIAGNOSIS — I700 Atherosclerosis of aorta: Secondary | ICD-10-CM

## 2024-03-31 DIAGNOSIS — E538 Deficiency of other specified B group vitamins: Secondary | ICD-10-CM

## 2024-03-31 DIAGNOSIS — K4090 Unilateral inguinal hernia, without obstruction or gangrene, not specified as recurrent: Secondary | ICD-10-CM | POA: Insufficient documentation

## 2024-03-31 DIAGNOSIS — R7303 Prediabetes: Secondary | ICD-10-CM | POA: Diagnosis not present

## 2024-03-31 DIAGNOSIS — R911 Solitary pulmonary nodule: Secondary | ICD-10-CM

## 2024-03-31 DIAGNOSIS — N400 Enlarged prostate without lower urinary tract symptoms: Secondary | ICD-10-CM | POA: Diagnosis not present

## 2024-03-31 DIAGNOSIS — R2681 Unsteadiness on feet: Secondary | ICD-10-CM | POA: Insufficient documentation

## 2024-03-31 LAB — PSA: PSA: 2.78 ng/mL (ref 0.10–4.00)

## 2024-03-31 LAB — COMPREHENSIVE METABOLIC PANEL WITH GFR
ALT: 22 U/L (ref 0–53)
AST: 16 U/L (ref 0–37)
Albumin: 4.3 g/dL (ref 3.5–5.2)
Alkaline Phosphatase: 95 U/L (ref 39–117)
BUN: 15 mg/dL (ref 6–23)
CO2: 29 meq/L (ref 19–32)
Calcium: 9.6 mg/dL (ref 8.4–10.5)
Chloride: 102 meq/L (ref 96–112)
Creatinine, Ser: 1.25 mg/dL (ref 0.40–1.50)
GFR: 54.11 mL/min — ABNORMAL LOW (ref >60.00)
Glucose, Bld: 131 mg/dL — ABNORMAL HIGH (ref 70–99)
Potassium: 4.4 meq/L (ref 3.5–5.1)
Sodium: 139 meq/L (ref 135–145)
Total Bilirubin: 1 mg/dL (ref 0.2–1.2)
Total Protein: 6.4 g/dL (ref 6.0–8.3)

## 2024-03-31 LAB — VITAMIN B12: Vitamin B-12: 262 pg/mL (ref 211–911)

## 2024-03-31 LAB — SEDIMENTATION RATE: Sed Rate: 9 mm/h (ref 0–20)

## 2024-03-31 LAB — LIPID PANEL
Cholesterol: 161 mg/dL (ref 0–200)
HDL: 39.6 mg/dL (ref >39.00)
LDL Cholesterol: 87 mg/dL (ref 0–99)
NonHDL: 121.75
Total CHOL/HDL Ratio: 4
Triglycerides: 176 mg/dL — ABNORMAL HIGH (ref 0.0–149.0)
VLDL: 35.2 mg/dL (ref 0.0–40.0)

## 2024-03-31 LAB — URINALYSIS, ROUTINE W REFLEX MICROSCOPIC
Bilirubin Urine: NEGATIVE
Hgb urine dipstick: NEGATIVE
Ketones, ur: NEGATIVE
Leukocytes,Ua: NEGATIVE
Nitrite: NEGATIVE
RBC / HPF: NONE SEEN (ref 0–?)
Specific Gravity, Urine: 1.025 (ref 1.000–1.030)
Total Protein, Urine: NEGATIVE
Urine Glucose: NEGATIVE
Urobilinogen, UA: 0.2 (ref 0.0–1.0)
pH: 6 (ref 5.0–8.0)

## 2024-03-31 LAB — BRAIN NATRIURETIC PEPTIDE: Pro B Natriuretic peptide (BNP): 49 pg/mL (ref 0.0–100.0)

## 2024-03-31 LAB — CBC WITH DIFFERENTIAL/PLATELET
Basophils Absolute: 0 K/uL (ref 0.0–0.1)
Basophils Relative: 0.5 % (ref 0.0–3.0)
Eosinophils Absolute: 0.9 K/uL — ABNORMAL HIGH (ref 0.0–0.7)
Eosinophils Relative: 13.7 % — ABNORMAL HIGH (ref 0.0–5.0)
HCT: 48.5 % (ref 39.0–52.0)
Hemoglobin: 16.3 g/dL (ref 13.0–17.0)
Lymphocytes Relative: 34.2 % (ref 12.0–46.0)
Lymphs Abs: 2.3 K/uL (ref 0.7–4.0)
MCHC: 33.5 g/dL (ref 30.0–36.0)
MCV: 92.3 fl (ref 78.0–100.0)
Monocytes Absolute: 0.4 K/uL (ref 0.1–1.0)
Monocytes Relative: 5.8 % (ref 3.0–12.0)
Neutro Abs: 3.1 K/uL (ref 1.4–7.7)
Neutrophils Relative %: 45.8 % (ref 43.0–77.0)
Platelets: 210 K/uL (ref 150.0–400.0)
RBC: 5.25 Mil/uL (ref 4.22–5.81)
RDW: 12.4 % (ref 11.5–15.5)
WBC: 6.7 K/uL (ref 4.0–10.5)

## 2024-03-31 LAB — HEMOGLOBIN A1C: Hgb A1c MFr Bld: 6.3 % (ref 4.6–6.5)

## 2024-03-31 LAB — TSH: TSH: 1.31 u[IU]/mL (ref 0.35–5.50)

## 2024-03-31 MED ORDER — B-12 2500 MCG PO TABS: 2500.0000 ug | ORAL_TABLET | Freq: Every day | ORAL | 3 refills | Status: AC

## 2024-03-31 MED ORDER — TADALAFIL 2.5 MG PO TABS: 2.5000 mg | ORAL_TABLET | Freq: Every day | ORAL | 1 refills | Status: AC | PRN

## 2024-03-31 MED ORDER — KETOCONAZOLE 2 % EX SHAM: 1.0000 | MEDICATED_SHAMPOO | CUTANEOUS | 0 refills | Status: AC

## 2024-03-31 NOTE — Assessment & Plan Note (Signed)
 Could be related to untreated OSA, has a h/o snoring, day time nap with STOP-BANG score of 3. Referral to sleep clinic for further evaluation.

## 2024-03-31 NOTE — Patient Instructions (Signed)
-   You are dur for a repeat lung CT. I will reach out to you once I hear back from my pulmonology colleague to update you on what kind of imaging we are doing.   - Use ketoconazole  shampoo twice a week and please schedule an appointment with yout dermatologist.   - I am referring you to sleep specialist for sleep apnea evaluation. I am also referring you to physical therapy to work on balance, reduce risk of fall.   - I recommend we reduce the dose of Cialis  from 10 mg as needed to 2.5 mg as needed.   - I will update you once I have lab results.

## 2024-03-31 NOTE — Assessment & Plan Note (Signed)
 Lifestyle modifications, risk factor management recommended. Repeat lipid panel today. Previously declined statin therapy based on documentation from previous PCP.

## 2024-03-31 NOTE — Assessment & Plan Note (Signed)
 Patient asymptomatic.  Repeat CMP, advice alcohol cessation, limit to 1-2 drinks per week. Consider RUQ if LFT continues to be abnormal.

## 2024-03-31 NOTE — Assessment & Plan Note (Signed)
 2 falls in last 6 months.  Leg length discrepancy from previous surgery to left leg could be further exacerbation.  Refer to physical therapy for strengthening exercises and fall prevention. Encourage use of a cane for now. F/u in 3 months.

## 2024-03-31 NOTE — Assessment & Plan Note (Signed)
 Patient asymptomatic. Discussed further evaluation if abdominal pain, nausea, vomiting.

## 2024-03-31 NOTE — Assessment & Plan Note (Signed)
 Check A1c.

## 2024-03-31 NOTE — Assessment & Plan Note (Signed)
 Chronic, taking tadalafil  10 mg, 3 times a week. In the context of fatigue and potential s/e from Tadalafil  recommend dose reduction to 2.5 mg daily prn. Refill tadalafil  prescription. F/u in 3 months.

## 2024-03-31 NOTE — Assessment & Plan Note (Signed)
 Presence of varicose veins without pain or swelling. Discussed compression stockings. Recommend wearing compression stockings.

## 2024-03-31 NOTE — Progress Notes (Addendum)
 Established Patient Office Visit TOC from Dr. Hope     Subjective  Patient ID: Brian Valencia, male    DOB: 1940-11-06  Age: 83 y.o. MRN: [id number]  Chief Complaint  Patient presents with   Establish Care    He  has a past medical history of Allergy, Arthritis of lumbar spine (02/17/2021), Asthma, Closed compression fracture of first lumbar vertebra (HCC) (04/15/2020), COVID-19, Fatty liver, History of SCC (squamous cell carcinoma) of skin, Skin cancer, and Vitamin D  deficiency.  HPI Discussed the use of AI scribe software for clinical note transcription with the patient, who gave verbal consent to proceed.  History of Present Illness Brian Valencia is an 83 year old male who presents for transfer of care from previous PCP, chronic medication management and has new concerns as mentioned below. He is accompanied by his wife, Virginia .  - Fatigue, snoring, day time sleepiness: He has been experiencing persistent fatigue for few months now, unchanged since his last visit in March. The fatigue is particularly noticeable when he does not eat, leading to feelings of anxiety, but improves after eating. He wakes up not feeling refreshed most mornings and experiences daytime fatigue, occasionally taking naps. He wakes up once at night to urinate, which he attributes to drinking a small glass of wine before bed. Has a h/o mildly elevated ferritin, hepatic steatosis. Drinks once glass of wine per night. He also has a h/o smoking which he quit when he was in his 30s. He denies chest pain, palpitations, shortness of breath, lower leg edema.   - Recurrent falls, unsteady gait: Had 2 falls within this year. He has a history of falls, with the most recent occurring in April, resulting in broken ribs and a small pneumothorax. He continues to experience occasional balance issues, particularly with his left leg, which he describes as weak and having some muscle atrophy. He attributes some falls to  accidental causes, such as tripping over objects, rather than solely due to imbalance. He occasionally uses a cane for support and has not engaged in physical therapy for his imbalance. Has not been through PT. Was seen at [facility] on 10/2023 after sustaining a fall. Attributes gait problem since left tibial fracture in 1973.   - He has varicose veins but does not wear compression stockings   - He has a history of fatty liver and drinks less than six ounces of wine, which may exacerbate liver damage. He quit smoking at age 73 and has a history of abnormal CT findings from August 2022, including a lung nodule and fatty liver, with a recommendation for follow-up imaging.  - He has a history of a left-sided inguinal hernia repair and a right-sided inguinal hernia that is asymptomatic (noted on CT abdomen in 2022). Also noted to have hiatal hernia/moderate.  No abdominal fullness, pain, or gastrointestinal symptoms. He also has a moderate hiatal hernia but denies symptoms of acid reflux.  - He takes Cialis  (tadalafil ) 10 mg occasionally for ED and also reports this helps with circulation issues in his legs. Takes this on average 3 times a week needs refill.   - His family history includes strokes in his mother and brother. He has a history of prediabetes, with an A1c in the prediabetic range as of March. He has had low vitamin D  and low-normal B12 levels in the past.  - Abnormal CT abdomen and pelvis 02/16/2021:  - Hepatic steatosis. - Nodular opacity at the right lung base with some calcification.  Recommend 3 month follow-up to ensure stability. - Right inguinal hernia containing small bowel. - Colonic diverticulosis. - Moderate hiatal hernia. - Aortic atherosclerosis.  No repeat imaging has been done.    ROS As per HPI    Objective:     BP 126/82 (BP Location: Right Arm, Patient Position: Sitting, Cuff Size: Normal)   Pulse 64   Temp 98.3 F (36.8 C) (Oral)   Ht 5' 7 (1.702 m)   Wt 162 lb  6.4 oz (73.7 kg)   SpO2 94%   BMI 25.44 kg/m      03/31/2024    8:38 AM 03/23/2024    9:02 AM 09/30/2023    8:50 AM  Depression screen PHQ 2/9  Decreased Interest 0 0 0  Down, Depressed, Hopeless 0 0 0  PHQ - 2 Score 0 0 0  Altered sleeping 1 2 0  Tired, decreased energy 1 1 1   Change in appetite 0 0 0  Feeling bad or failure about yourself  0 0 0  Trouble concentrating 0 0 0  Moving slowly or fidgety/restless 0 0 0  Suicidal thoughts 0 0 0  PHQ-9 Score 2 3 1   Difficult doing work/chores  Not difficult at all Not difficult at all      03/31/2024    8:38 AM 09/30/2023    8:50 AM 08/07/2022   10:03 AM 04/15/2020    9:06 AM  GAD 7 : Generalized Anxiety Score  Nervous, Anxious, on Edge 0 0 0 0  Control/stop worrying 0 0 0 0  Worry too much - different things 0 0 1 0  Trouble relaxing 0 0 0 0  Restless 0 1 1 0  Easily annoyed or irritable 0 0 0 0  Afraid - awful might happen 0 0 0 0  Total GAD 7 Score 0 1 2 0  Anxiety Difficulty Not difficult at all Not difficult at all Not difficult at all Not difficult at all      03/31/2024    8:38 AM 03/23/2024    9:02 AM 09/30/2023    8:50 AM  Depression screen PHQ 2/9  Decreased Interest 0 0 0  Down, Depressed, Hopeless 0 0 0  PHQ - 2 Score 0 0 0  Altered sleeping 1 2 0  Tired, decreased energy 1 1 1   Change in appetite 0 0 0  Feeling bad or failure about yourself  0 0 0  Trouble concentrating 0 0 0  Moving slowly or fidgety/restless 0 0 0  Suicidal thoughts 0 0 0  PHQ-9 Score 2 3 1   Difficult doing work/chores  Not difficult at all Not difficult at all      03/31/2024    8:38 AM 09/30/2023    8:50 AM 08/07/2022   10:03 AM 04/15/2020    9:06 AM  GAD 7 : Generalized Anxiety Score  Nervous, Anxious, on Edge 0 0 0 0  Control/stop worrying 0 0 0 0  Worry too much - different things 0 0 1 0  Trouble relaxing 0 0 0 0  Restless 0 1 1 0  Easily annoyed or irritable 0 0 0 0  Afraid - awful might happen 0 0 0 0  Total GAD 7 Score 0  1 2 0  Anxiety Difficulty Not difficult at all Not difficult at all Not difficult at all Not difficult at all   SDOH Screenings   Food Insecurity: No Food Insecurity (03/23/2024)  Housing: Unknown (03/23/2024)  Transportation Needs: No Transportation  Needs (03/23/2024)  Utilities: Not At Risk (03/23/2024)  Alcohol Screen: Low Risk  (03/23/2024)  Depression (PHQ2-9): Low Risk  (03/31/2024)  Financial Resource Strain: Low Risk  (03/23/2024)  Physical Activity: Insufficiently Active (03/23/2024)  Social Connections: Moderately Integrated (03/23/2024)  Stress: No Stress Concern Present (03/23/2024)  Tobacco Use: Medium Risk (03/31/2024)  Health Literacy: Adequate Health Literacy (03/23/2024)     Physical Exam Constitutional:      General: He is not in acute distress. HENT:     Head: Normocephalic.     Right Ear: Tympanic membrane normal.     Left Ear: Tympanic membrane normal.     Mouth/Throat:     Mouth: Mucous membranes are moist.  Eyes:     General: No scleral icterus. Cardiovascular:     Rate and Rhythm: Normal rate.     Heart sounds: No murmur heard. Pulmonary:     Effort: Pulmonary effort is normal.     Breath sounds: Normal breath sounds. No wheezing.  Abdominal:     General: Bowel sounds are normal.     Palpations: Abdomen is soft.     Tenderness: There is no abdominal tenderness. There is no guarding or rebound.  Musculoskeletal:     Cervical back: Neck supple.     Right lower leg: No edema.     Left lower leg: No edema.     Comments: Bilateral lower legs varicosities without edema or ulcerations   Lymphadenopathy:     Cervical: No cervical adenopathy.  Skin:    Comments: Greasy, white/yellow scales in between eyebrows, face noted. Multiple dry, flaky spots on face, nose noted.   Neurological:     Mental Status: He is alert and oriented to person, place, and time.     Gait: Gait abnormal (slow, unsteady, narrow, shuffling gait when walking).        No results found  for any visits on 03/31/24.  The ASCVD Risk score (Arnett DK, et al., 2019) failed to calculate for the following reasons:   The 2019 ASCVD risk score is only valid for ages 72 to 75    Following result discussed during today's visit. CT abdomen and pelvis 02/16/2021:  - Hepatic steatosis. - Nodular opacity at the right lung base with some calcification. Recommend 3 month follow-up to ensure stability. - Right inguinal hernia containing small bowel. - Colonic diverticulosis. - Moderate hiatal hernia.  - Aortic atherosclerosis.   Assessment & Plan:  Patient is a pleasant 83 year old male presenting for acute, chronic concerns and establish care.   Elevated ferritin level Assessment & Plan: Chronically elevated Ferritin in the past. In the context of chronic fatigue, recommend: Check CBC, iron panel, ESR, CMP.  Elevated ferritin could be related to hepatic steatosis. Management pending result. Consider RUQ ultrasound.    Orders: -     Iron, TIBC and Ferritin Panel -     CBC with Differential/Platelet  Other fatigue Assessment & Plan: Chronic. D/D cardio-metabolic ( Check BNP), check A1c to r/o progression of prediabetics to diabetes, hepato/renal check CMP, check B12, vitamin D , CBC, iron panel, ESR, TSH.   Reduce Tadalafil  dose form 10 mg daily to 2.5 mg once daily prn. Also encourage healthy snacks in between meals.   Orders: -     Sedimentation rate -     Comprehensive metabolic panel with GFR -     Brain natriuretic peptide -     TSH -     Vitamin B12  Snoring Assessment &  Plan: Could be related to untreated OSA, has a h/o snoring, day time nap with STOP-BANG score of 3. Referral to sleep clinic for further evaluation.   Orders: -     Pulmonary Visit  Hepatic steatosis Assessment & Plan: Patient asymptomatic.  Repeat CMP, advice alcohol cessation, limit to 1-2 drinks per week. Consider RUQ if LFT continues to be abnormal.    Prediabetes Assessment & Plan: Check  A1c  Orders: -     Hemoglobin A1c  Nocturia Assessment & Plan: Could be related to untreated OSA, has a h/o snoring, day time nap with STOP-BANG score of 3. Referral to sleep clinic for further evaluation. Continue decreasing fluid intake prior to bedtime.  Continue to avoid ETOH prior to bedtime   Orders: -     Urinalysis, Routine w reflex microscopic -     PSA  Seborrheic dermatitis Assessment & Plan: Dry, flaky skin of face. Recommended dermatology follow-up, does not need referral to dermatology as he is already established with one.  Prescribe ketoconazole  shampoo twice a week.   Orders: -     Ketoconazole ; Apply 1 Application topically 2 (two) times a week.  Dispense: 120 mL; Refill: 0  Erectile dysfunction, unspecified erectile dysfunction type Assessment & Plan: Chronic, taking tadalafil  10 mg, 3 times a week. In the context of fatigue and potential s/e from Tadalafil  recommend dose reduction to 2.5 mg daily prn. Refill tadalafil  prescription. F/u in 3 months.   Orders: -     Tadalafil ; Take 1 tablet (2.5 mg total) by mouth daily as needed.  Dispense: 90 tablet; Refill: 1  Screening for lipoid disorders Assessment & Plan: Check lipid panel.  Orders: -     Lipid panel  Benign prostatic hyperplasia, unspecified whether lower urinary tract symptoms present Assessment & Plan: No significant worsening of symptoms. Nocturia 1-2 per night, otherwise no new symptoms. Check PSA, UA today. Continue limit fluid intake in the evening, encourage alcohol cessation or limitations to no more than 1-2 drinks per week. Monitor for changes in urinary symptoms and consider urology evaluation.   Orders: -     Urinalysis, Routine w reflex microscopic -     PSA  Recurrent falls Assessment & Plan: 2 falls in last 6 months.  Leg length discrepancy from previous surgery to left leg could be further exacerbation.  Refer to physical therapy for strengthening exercises and fall prevention.  Encourage use of a cane for now. F/u in 3 months.   Orders: -     Ambulatory referral to Physical Therapy  Unsteady gait when walking Assessment & Plan: 2 falls in last 6 months.  Leg length discrepancy from previous surgery to left leg could be further exacerbation.  Refer to physical therapy for strengthening exercises and fall prevention. Encourage use of a cane for now. F/u in 3 months.   Orders: -     Ambulatory referral to Physical Therapy  Asymptomatic varicose veins of both lower extremities Assessment & Plan: Presence of varicose veins without pain or swelling. Discussed compression stockings. Recommend wearing compression stockings.   Aortic atherosclerosis Assessment & Plan: Lifestyle modifications, risk factor management recommended. Repeat lipid panel today. Previously declined statin therapy based on documentation from previous PCP.      Nodule of right lung Assessment & Plan: Noted on CT abdomen pelvis on 02/16/21. 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  Repeat CT without contrast, ordered today. Pulmonology curbside was done, if abnormal CT, Dr.  Dgayli plans on seeing pt in the clinic, if normal no further evaluation recommended.   Orders: -     CT CHEST WO CONTRAST; Future  Hiatal hernia Assessment & Plan: Patient asymptomatic, result of CT abdomen pelvis from 02/16/21 discussed.    Right inguinal hernia Assessment & Plan: Patient asymptomatic. Discussed further evaluation if abdominal pain, nausea, vomiting.   Orders: -     Pulmonary Visit    Return in about 3 months (around 06/30/2024) for Chronic follow up .  F/u for fatigue, balance, fall.   Luke Shade, MD

## 2024-03-31 NOTE — Assessment & Plan Note (Signed)
 Patient asymptomatic, result of CT abdomen pelvis from 02/16/21 discussed.

## 2024-03-31 NOTE — Assessment & Plan Note (Addendum)
 Chronic. D/D cardio-metabolic ( Check BNP), check A1c to r/o progression of prediabetics to diabetes, hepato/renal check CMP, check B12, vitamin D , CBC, iron panel, ESR, TSH.   Reduce Tadalafil  dose form 10 mg daily to 2.5 mg once daily prn. Also encourage healthy snacks in between meals.

## 2024-03-31 NOTE — Assessment & Plan Note (Signed)
 Could be related to untreated OSA, has a h/o snoring, day time nap with STOP-BANG score of 3. Referral to sleep clinic for further evaluation. Continue decreasing fluid intake prior to bedtime.  Continue to avoid ETOH prior to bedtime

## 2024-03-31 NOTE — Addendum Note (Signed)
 Addended by: Breauna Mazzeo on: 03/31/2024 10:51 AM   Modules accepted: Orders

## 2024-03-31 NOTE — Assessment & Plan Note (Signed)
 Dry, flaky skin of face. Recommended dermatology follow-up, does not need referral to dermatology as he is already established with one.  Prescribe ketoconazole  shampoo twice a week.

## 2024-03-31 NOTE — Assessment & Plan Note (Signed)
 Check lipid panel

## 2024-03-31 NOTE — Assessment & Plan Note (Signed)
 Noted on CT abdomen pelvis on 02/16/21. 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  Repeat CT without contrast, ordered today. Pulmonology curbside was done, if abnormal CT, Dr. Isadora plans on seeing pt in the clinic, if normal no further evaluation recommended.

## 2024-03-31 NOTE — Assessment & Plan Note (Signed)
 No significant worsening of symptoms. Nocturia 1-2 per night, otherwise no new symptoms. Check PSA, UA today. Continue limit fluid intake in the evening, encourage alcohol cessation or limitations to no more than 1-2 drinks per week. Monitor for changes in urinary symptoms and consider urology evaluation.

## 2024-03-31 NOTE — Assessment & Plan Note (Signed)
 Chronically elevated Ferritin in the past. In the context of chronic fatigue, recommend: Check CBC, iron panel, ESR, CMP.  Elevated ferritin could be related to hepatic steatosis. Management pending result. Consider RUQ ultrasound.

## 2024-04-01 DIAGNOSIS — H5203 Hypermetropia, bilateral: Secondary | ICD-10-CM | POA: Diagnosis not present

## 2024-04-01 DIAGNOSIS — H2513 Age-related nuclear cataract, bilateral: Secondary | ICD-10-CM | POA: Diagnosis not present

## 2024-04-01 DIAGNOSIS — H524 Presbyopia: Secondary | ICD-10-CM | POA: Diagnosis not present

## 2024-04-01 DIAGNOSIS — H52223 Regular astigmatism, bilateral: Secondary | ICD-10-CM | POA: Diagnosis not present

## 2024-04-01 LAB — IRON,TIBC AND FERRITIN PANEL
%SAT: 50 % — ABNORMAL HIGH (ref 20–48)
Ferritin: 309 ng/mL (ref 24–380)
Iron: 131 ug/dL (ref 50–180)
TIBC: 263 ug/dL (ref 250–425)

## 2024-04-01 NOTE — Progress Notes (Signed)
 Please call the patient to let him know (last mychart login 08/17/2022):   So far your lab results looks reassuring. You do not have anemia. Ferritin is normalized. Iron panel looks normal.    Kidney function is mildly decreased but stable, and electrolytes are normal. Remember to avoid use of Advil , Motrin, Aleve, referred to as NSAIDs to protect your kidney function.   Liver function looks good. A1c is still in prediabetic range.  Cholesterol shows mild elevation in triglycerides which can be improved with reduced in consumption of carbohydrate rich food (eg bread, pasta, sweet beverage, processed food).    Inflammatory marker or ESR came back normal. One of the labs done to evaluate for heart failure called BNP came back normal as well. PSA, urine, thyroid  hormone all looks normal.    B12 is on low normal side. Recommend starting B12 daily 2500 mcg. I have sent prescription for this but if insurance does not cover, he can get this over the counter.    Thank you,  Luke Shade, MD

## 2024-04-06 ENCOUNTER — Ambulatory Visit
Admission: RE | Admit: 2024-04-06 | Discharge: 2024-04-06 | Disposition: A | Discharge status: Home / Self Care | Source: Ambulatory Visit

## 2024-04-06 DIAGNOSIS — I2510 Atherosclerotic heart disease of native coronary artery without angina pectoris: Secondary | ICD-10-CM | POA: Diagnosis not present

## 2024-04-06 DIAGNOSIS — R911 Solitary pulmonary nodule: Secondary | ICD-10-CM | POA: Insufficient documentation

## 2024-04-06 DIAGNOSIS — R918 Other nonspecific abnormal finding of lung field: Secondary | ICD-10-CM | POA: Diagnosis not present

## 2024-04-07 DIAGNOSIS — L570 Actinic keratosis: Secondary | ICD-10-CM | POA: Diagnosis not present

## 2024-04-07 DIAGNOSIS — D492 Neoplasm of unspecified behavior of bone, soft tissue, and skin: Secondary | ICD-10-CM | POA: Diagnosis not present

## 2024-04-07 NOTE — Progress Notes (Signed)
 Please reach out to the patient to let him know I reviewed his lung CT results. After discussing his imaging findings with pulmonologist Dr. Isadora we recommend he follows up with pulmonologist as scheduled for tomorrow 04/08/24 to discuss result findings.   Imaging also showed patient to have colon diverticulosis. Recommend high fiber diet (25-30 grams of fiber per day from fruits, vegetables, whole grains, legumes), stay hydrated, regular exercise as tolerated. Liver shows mild fatty changes, like discussed during office visit encourage healthy eating, cutting down on alcohol intake. He has hiatal hernia which can cause symptoms like heartburn, we will continue to monitor this.   Thank you, Brian Shade, MD

## 2024-04-07 NOTE — Progress Notes (Signed)
 Noted

## 2024-04-08 ENCOUNTER — Telehealth: Payer: Self-pay

## 2024-04-08 ENCOUNTER — Ambulatory Visit: Admitting: Student in an Organized Health Care Education/Training Program

## 2024-04-08 ENCOUNTER — Encounter: Payer: Self-pay | Admitting: Student in an Organized Health Care Education/Training Program

## 2024-04-08 VITALS — BP 136/82 | HR 77 | Temp 97.5°F | Ht 67.0 in | Wt 161.6 lb

## 2024-04-08 DIAGNOSIS — G8929 Other chronic pain: Secondary | ICD-10-CM | POA: Insufficient documentation

## 2024-04-08 DIAGNOSIS — R911 Solitary pulmonary nodule: Secondary | ICD-10-CM
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
## 2024-04-08 NOTE — Telephone Encounter (Signed)
 Pt was in  office wanting know if Dr. Abbey can add physical therapy for his shoulder as well. Pt complaining he is still having pains in his shoulder.

## 2024-04-08 NOTE — Telephone Encounter (Signed)
 He has PT referral for gait, balance ordered on his last visit with me on 03/31/2024. I added PT for bilateral shoulder as well. Please update patient on this.   Thank you,  Luke Shade, MD

## 2024-04-09 NOTE — Telephone Encounter (Signed)
 Called pt he is aware

## 2024-04-09 NOTE — Telephone Encounter (Signed)
 Pt came back in the office wanting his referral sent to [facility] and also insisting about a back brace.

## 2024-04-10 NOTE — Telephone Encounter (Signed)
 Pt has stopped in again today requesting an update on information about a back brace prescription and is stating that [facility] told him they have not received the PT referral. Please reach out to patient.

## 2024-04-10 NOTE — Telephone Encounter (Signed)
 Patient notified and was provided with contact information. Patient verbalized understanding.

## 2024-04-14 ENCOUNTER — Ambulatory Visit: Admitting: Physical Therapy

## 2024-04-14 DIAGNOSIS — M25512 Pain in left shoulder: Secondary | ICD-10-CM | POA: Diagnosis not present

## 2024-04-14 DIAGNOSIS — M5459 Other low back pain: Secondary | ICD-10-CM | POA: Insufficient documentation

## 2024-04-14 DIAGNOSIS — R262 Difficulty in walking, not elsewhere classified: Secondary | ICD-10-CM | POA: Insufficient documentation

## 2024-04-14 DIAGNOSIS — M25511 Pain in right shoulder: Secondary | ICD-10-CM | POA: Insufficient documentation

## 2024-04-14 DIAGNOSIS — R2689 Other abnormalities of gait and mobility: Secondary | ICD-10-CM | POA: Insufficient documentation

## 2024-04-14 DIAGNOSIS — G8929 Other chronic pain: Secondary | ICD-10-CM | POA: Insufficient documentation
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
## 2024-04-21 ENCOUNTER — Ambulatory Visit: Admitting: Physical Therapy

## 2024-04-21 DIAGNOSIS — M5459 Other low back pain: Secondary | ICD-10-CM

## 2024-04-21 DIAGNOSIS — R262 Difficulty in walking, not elsewhere classified: Secondary | ICD-10-CM

## 2024-04-21 DIAGNOSIS — R2689 Other abnormalities of gait and mobility: Secondary | ICD-10-CM
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
## 2024-04-23 ENCOUNTER — Ambulatory Visit: Admitting: Physical Therapy

## 2024-04-23 ENCOUNTER — Encounter: Payer: Self-pay | Admitting: Physical Therapy

## 2024-04-23 DIAGNOSIS — M5459 Other low back pain: Secondary | ICD-10-CM | POA: Diagnosis not present

## 2024-04-23 DIAGNOSIS — R2689 Other abnormalities of gait and mobility: Secondary | ICD-10-CM

## 2024-04-23 DIAGNOSIS — R262 Difficulty in walking, not elsewhere classified: Secondary | ICD-10-CM
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
## 2024-04-28 ENCOUNTER — Ambulatory Visit: Admitting: Physical Therapy

## 2024-04-28 DIAGNOSIS — R262 Difficulty in walking, not elsewhere classified: Secondary | ICD-10-CM

## 2024-04-28 DIAGNOSIS — M5459 Other low back pain: Secondary | ICD-10-CM

## 2024-04-28 DIAGNOSIS — R2689 Other abnormalities of gait and mobility: Secondary | ICD-10-CM
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
## 2024-04-30 ENCOUNTER — Ambulatory Visit: Admitting: Physical Therapy

## 2024-04-30 DIAGNOSIS — R2689 Other abnormalities of gait and mobility: Secondary | ICD-10-CM

## 2024-04-30 DIAGNOSIS — M5459 Other low back pain: Secondary | ICD-10-CM | POA: Diagnosis not present

## 2024-04-30 DIAGNOSIS — R262 Difficulty in walking, not elsewhere classified: Secondary | ICD-10-CM
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
## 2024-05-05 ENCOUNTER — Ambulatory Visit: Admitting: Physical Therapy

## 2024-05-05 DIAGNOSIS — R2689 Other abnormalities of gait and mobility: Secondary | ICD-10-CM

## 2024-05-05 DIAGNOSIS — M5459 Other low back pain: Secondary | ICD-10-CM | POA: Diagnosis not present

## 2024-05-05 DIAGNOSIS — R262 Difficulty in walking, not elsewhere classified: Secondary | ICD-10-CM
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
## 2024-05-07 ENCOUNTER — Ambulatory Visit: Admitting: Physical Therapy

## 2024-05-07 DIAGNOSIS — R262 Difficulty in walking, not elsewhere classified: Secondary | ICD-10-CM

## 2024-05-07 DIAGNOSIS — M5459 Other low back pain: Secondary | ICD-10-CM

## 2024-05-07 DIAGNOSIS — R2689 Other abnormalities of gait and mobility: Secondary | ICD-10-CM
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
## 2024-05-12 DIAGNOSIS — D485 Neoplasm of uncertain behavior of skin: Secondary | ICD-10-CM | POA: Diagnosis not present

## 2024-05-12 DIAGNOSIS — C44529 Squamous cell carcinoma of skin of other part of trunk: Secondary | ICD-10-CM | POA: Diagnosis not present

## 2024-05-13 ENCOUNTER — Ambulatory Visit: Admitting: Physical Therapy

## 2024-05-19 ENCOUNTER — Encounter: Admitting: Physical Therapy

## 2024-05-21 ENCOUNTER — Encounter: Admitting: Physical Therapy

## 2024-05-25 ENCOUNTER — Encounter: Admitting: Physical Therapy

## 2024-05-27 ENCOUNTER — Encounter: Admitting: Physical Therapy

## 2024-06-08 ENCOUNTER — Encounter: Admitting: Physical Therapy

## 2024-06-10 ENCOUNTER — Encounter: Admitting: Physical Therapy

## 2024-06-15 ENCOUNTER — Encounter: Admitting: Physical Therapy

## 2024-06-18 ENCOUNTER — Encounter: Admitting: Physical Therapy

## 2024-06-24 ENCOUNTER — Encounter

## 2025-03-24 ENCOUNTER — Ambulatory Visit
